# Patient Record
Sex: Male | Born: 1985 | Race: White | Hispanic: No | Marital: Married | Smoking: Current every day smoker
Health system: Southern US, Community
[De-identification: ages and names within clinical notes are randomized; demographics above are authoritative.]

## PROBLEM LIST (undated history)

## (undated) DIAGNOSIS — B999 Unspecified infectious disease: Secondary | ICD-10-CM

## (undated) DIAGNOSIS — J189 Pneumonia, unspecified organism: Secondary | ICD-10-CM

## (undated) DIAGNOSIS — K219 Gastro-esophageal reflux disease without esophagitis: Secondary | ICD-10-CM

## (undated) HISTORY — PX: WISDOM TOOTH EXTRACTION: SHX21

---

## 2015-08-31 ENCOUNTER — Emergency Department (HOSPITAL_COMMUNITY)
Admission: EM | Admit: 2015-08-31 | Discharge: 2015-09-01 | Disposition: A | Discharge status: Home / Self Care | Payer: BLUE CROSS/BLUE SHIELD | Attending: Emergency Medicine | Admitting: Emergency Medicine

## 2015-08-31 ENCOUNTER — Encounter (HOSPITAL_COMMUNITY): Payer: Self-pay | Admitting: Emergency Medicine

## 2015-08-31 DIAGNOSIS — S81831D Puncture wound without foreign body, right lower leg, subsequent encounter: Secondary | ICD-10-CM | POA: Diagnosis not present

## 2015-08-31 DIAGNOSIS — Y288XXD Contact with other sharp object, undetermined intent, subsequent encounter: Secondary | ICD-10-CM | POA: Diagnosis not present

## 2015-08-31 DIAGNOSIS — M109 Gout, unspecified: Secondary | ICD-10-CM | POA: Diagnosis present

## 2015-08-31 DIAGNOSIS — F17210 Nicotine dependence, cigarettes, uncomplicated: Secondary | ICD-10-CM | POA: Diagnosis not present

## 2015-08-31 MED ORDER — KETOROLAC TROMETHAMINE 30 MG/ML IJ SOLN
60.0000 mg | Freq: Once | INTRAMUSCULAR | Status: AC
  Administered 2015-09-01: 60 mg via INTRAMUSCULAR
  Filled 2015-08-31: qty 2

## 2015-08-31 NOTE — ED Notes (Signed)
Pt states a wire went into the left ankle last week at work and since then he has been having bilateral leg and foot pain.

## 2015-08-31 NOTE — ED Provider Notes (Signed)
CSN: [id number]     Arrival date & time 08/31/15  2243 History   By signing my name below, I, Calvin [name], attest that this documentation has been prepared under the direction and in the presence of Calvin Albe, MD at 2343.   Electronically Signed: Iona [name], ED Scribe. 09/01/2015. 12:55 AM  Chief Complaint  Patient presents with  . Leg Pain    The history is provided by the patient. No language interpreter was used.   HPI Comments: Calvin [name] is a 30 y.o. male who presents to the Emergency Department complaining of gradual onset, constant, worsening,aching right  ankle pain  after a thin wire stuck into his right medial ankle at work on 08/22/2015 (approximately 9 days ago). Pt immediately removed the wire after it was stuck and does not believe any portion of it is still in his skin. He states it was just barely in the skin. He states it was sore the following day and the day after that it started swelling.  He was seen by his PCP, Dr. Iris Pert, on Jan 19 and was prescribed  Bactrim, had blood work done, had an right ankle x-ray, and had a tetanus shot. Pt states he has not heard back from his PCP about his results.  Pt reports swelling around the right ankle where the wire struck and says the pain is worsened with walking and standing. Pt reports swelling subsided over the weekend and returned on Monday, 08/29/2015 (approximately 3 days ago). He is also having pain since the 23rd in his left foot and believes he is having foot pain due to compensating by placing more weight onto that foot.  Pt denies fever,drainage, chills, nausea, vomiting. He states moving his toes makes the pain worse, elevating his foot makes the redness and a's ankle get better. No hx gout.   PCP [facility] [place]  History reviewed. No pertinent past medical history. History reviewed. No pertinent past surgical history. No family history on file. Social History  Substance Use Topics  . Smoking status:  Current Every Day Smoker    Types: Cigarettes  . Smokeless tobacco: None  . Alcohol Use: No  employed Sober from alcohol for 90 days. Smokes 1 ppd  Review of Systems  Constitutional: Negative for fever and chills.  Gastrointestinal: Negative for nausea and vomiting.  Musculoskeletal: Positive for joint swelling and arthralgias (Bilateral ankle pain).  All other systems reviewed and are negative.   Allergies  Review of patient's allergies indicates not on file.  Home Medications   Prior to Admission medications   Medication Sig Start Date End Date Taking? Authorizing Provider  colchicine 0.6 MG tablet Take 1 tablet (0.6 mg total) by mouth 2 (two) times daily. 09/01/15   Calvin Albe, MD  doxycycline (VIBRAMYCIN) 100 MG capsule Take 1 capsule (100 mg total) by mouth 2 (two) times daily. 09/01/15   Calvin Albe, MD  naproxen (NAPROSYN) 500 MG tablet Take 1 po BID with food prn pain 09/01/15   Calvin Albe, MD   BP 118/79 mmHg  Pulse 83  Temp(Src) 98.3 F (36.8 C) (Oral)  Resp 18  Ht  (1.803 m)  Wt 174 lb (78.926 kg)  BMI 24.28 kg/m2  SpO2 96%  Vital signs normal   Physical Exam  Constitutional: He is oriented to person, place, and time. He appears well-developed and well-nourished.  Non-toxic appearance. He does not appear ill. No distress.  HENT:  Head: Normocephalic and atraumatic.  Right Ear: External ear  normal.  Left Ear: External ear normal.  Nose: Nose normal. No mucosal edema or rhinorrhea.  Mouth/Throat: Mucous membranes are normal. No dental abscesses or uvula swelling.  Eyes: Conjunctivae and EOM are normal.  Neck: Normal range of motion and full passive range of motion without pain.  Pulmonary/Chest: Effort normal. No respiratory distress. He has no rhonchi. He exhibits no crepitus.  Abdominal: Normal appearance.  Musculoskeletal: Normal range of motion. He exhibits edema and tenderness.  LLE diffuse swelling and redness over MTP joint of the great toe with  TTP. RLE mild swelling around the medial malleolus with mild redness.  No obvious puncture wound, purulent drainage, pustules, or evidence of abscess.  Neurological: He is alert and oriented to person, place, and time. He has normal strength. No cranial nerve deficit.  Skin: Skin is warm, dry and intact. No rash noted. No erythema. No pallor.  Psychiatric: He has a normal mood and affect. His speech is normal and behavior is normal. His mood appears not anxious.  Nursing note and vitals reviewed.    RLE   LLE     ED Course  Procedures (including critical care time)  Medications  doxycycline (VIBRA-TABS) tablet 100 mg (not administered)  colchicine tablet 0.6 mg (not administered)  ketorolac (TORADOL) 30 MG/ML injection 60 mg (60 mg Intramuscular Given 09/01/15 0001)    DIAGNOSTIC STUDIES: Oxygen Saturation is 96% on RA, normal by my interpretation.    COORDINATION OF CARE: 11:53 PM-Discussed treatment plan which includes DG R Ankle, Uric acid, and CBC with pt at bedside and pt agreed to plan.   0050 Pt states his pain is better. Discussed his discharge instructions.   Patient presents after a puncture wound to his right medial ankle with persistent pain that he describes as aching with mild swelling and redness. He is already been on Bactrim for almost a week without improvement of his symptoms. He reports they are having a lot of MRSA infections at work from being punctured by the wire. He also is having acute pain in his left toe consistent with acute gout although his uric acid level is normal. Doxycycline was added to his antibiotic regimen, he was also started on anti-inflammatory and colchicine. He was referred to orthopedics if he is not improving over the next several days.   Labs Review Results for orders placed or performed during the hospital encounter of 08/31/15  Uric acid  Result Value Ref Range   Uric Acid, Serum 4.9 4.4 - 7.6 mg/dL  CBC with Differential   Result Value Ref Range   WBC 5.6 4.0 - 10.5 K/uL   RBC 4.34 4.22 - 5.81 MIL/uL   Hemoglobin 13.3 13.0 - 17.0 g/dL   HCT 16.1 (L) 09.6 - 04.5 %   MCV 88.0 78.0 - 100.0 fL   MCH 30.6 26.0 - 34.0 pg   MCHC 34.8 30.0 - 36.0 g/dL   RDW 40.9 81.1 - 91.4 %   Platelets 294 150 - 400 K/uL   Neutrophils Relative % 75 %   Neutro Abs 4.2 1.7 - 7.7 K/uL   Lymphocytes Relative 12 %   Lymphs Abs 0.7 0.7 - 4.0 K/uL   Monocytes Relative 10 %   Monocytes Absolute 0.6 0.1 - 1.0 K/uL   Eosinophils Relative 2 %   Eosinophils Absolute 0.1 0.0 - 0.7 K/uL   Basophils Relative 1 %   Basophils Absolute 0.0 0.0 - 0.1 K/uL    Laboratory interpretation all normal    Imaging  Review Dg Ankle Complete Right  09/01/2015  CLINICAL DATA:  Erythema and tenderness at the medial right ankle after being stuck with piece of metal wire at the right ankle 1 week ago. Initial encounter. EXAM: RIGHT ANKLE - COMPLETE 3+ VIEW COMPARISON:  None. FINDINGS: There is no evidence of fracture or dislocation. There is no evidence of osseous erosion. The ankle mortise is intact; the interosseous space is within normal limits. No talar tilt or subluxation is seen. The joint spaces are preserved. The known puncture wound is not well characterized on radiograph. No radiopaque foreign bodies are seen. IMPRESSION: No evidence of fracture or dislocation. No evidence of osseous erosion. No radiopaque foreign bodies seen. Electronically Signed   By: Roanna Raider M.D.   On: 09/01/2015 00:21   I have personally reviewed and evaluated these images and lab results as part of my medical decision-making.   EKG Interpretation None      MDM   Final diagnoses:  Puncture wound, lower leg, right, subsequent encounter  Podagra   New Prescriptions   COLCHICINE 0.6 MG TABLET    Take 1 tablet (0.6 mg total) by mouth 2 (two) times daily.   DOXYCYCLINE (VIBRAMYCIN) 100 MG CAPSULE    Take 1 capsule (100 mg total) by mouth 2 (two) times daily.    NAPROXEN (NAPROSYN) 500 MG TABLET    Take 1 po BID with food prn pain    Plan discharge  Calvin Albe, MD, FACEP   I personally performed the services described in this documentation, which was scribed in my presence. The recorded information has been reviewed and considered.  Calvin Albe, MD, Concha Pyo, MD 09/01/15 [phone]

## 2015-09-01 ENCOUNTER — Emergency Department (HOSPITAL_COMMUNITY): Payer: BLUE CROSS/BLUE SHIELD

## 2015-09-01 LAB — CBC WITH DIFFERENTIAL/PLATELET
Basophils Absolute: 0 10*3/uL (ref 0.0–0.1)
Basophils Relative: 1 %
Eosinophils Absolute: 0.1 10*3/uL (ref 0.0–0.7)
Eosinophils Relative: 2 %
HEMATOCRIT: 38.2 % — AB (ref 39.0–52.0)
HEMOGLOBIN: 13.3 g/dL (ref 13.0–17.0)
LYMPHS PCT: 12 %
Lymphs Abs: 0.7 10*3/uL (ref 0.7–4.0)
MCH: 30.6 pg (ref 26.0–34.0)
MCHC: 34.8 g/dL (ref 30.0–36.0)
MCV: 88 fL (ref 78.0–100.0)
MONO ABS: 0.6 10*3/uL (ref 0.1–1.0)
MONOS PCT: 10 %
NEUTROS ABS: 4.2 10*3/uL (ref 1.7–7.7)
NEUTROS PCT: 75 %
Platelets: 294 10*3/uL (ref 150–400)
RBC: 4.34 MIL/uL (ref 4.22–5.81)
RDW: 11.8 % (ref 11.5–15.5)
WBC: 5.6 10*3/uL (ref 4.0–10.5)

## 2015-09-01 LAB — URIC ACID: Uric Acid, Serum: 4.9 mg/dL (ref 4.4–7.6)

## 2015-09-01 MED ORDER — NAPROXEN 500 MG PO TABS: ORAL_TABLET | ORAL | Status: AC

## 2015-09-01 MED ORDER — COLCHICINE 0.6 MG PO TABS: 0.6000 mg | ORAL_TABLET | Freq: Two times a day (BID) | ORAL | Status: AC

## 2015-09-01 MED ORDER — DOXYCYCLINE HYCLATE 100 MG PO TABS
100.0000 mg | ORAL_TABLET | Freq: Once | ORAL | Status: AC
  Administered 2015-09-01: 100 mg via ORAL
  Filled 2015-09-01: qty 1

## 2015-09-01 MED ORDER — COLCHICINE 0.6 MG PO TABS
0.6000 mg | ORAL_TABLET | Freq: Once | ORAL | Status: AC
  Administered 2015-09-01: 0.6 mg via ORAL
  Filled 2015-09-01: qty 1

## 2015-09-01 MED ORDER — DOXYCYCLINE HYCLATE 100 MG PO CAPS: 100.0000 mg | ORAL_CAPSULE | Freq: Two times a day (BID) | ORAL | Status: DC

## 2015-09-01 NOTE — Discharge Instructions (Signed)
Elevate your foot. Use ice and heat for comfort over the swollen painful areas. Finish the bactrim and start the new antibiotic. Take the medications as prescribed for pain. Follow up with Dr Romeo Apple if you pain and swelling isn't improving over the next several days. Call his office to get an appointment.

## 2015-12-19 ENCOUNTER — Other Ambulatory Visit: Payer: Self-pay | Admitting: Orthopedic Surgery

## 2015-12-19 DIAGNOSIS — M25571 Pain in right ankle and joints of right foot: Secondary | ICD-10-CM

## 2015-12-21 ENCOUNTER — Ambulatory Visit
Admission: RE | Admit: 2015-12-21 | Discharge: 2015-12-21 | Disposition: A | Discharge status: Home / Self Care | Payer: Worker's Compensation | Source: Ambulatory Visit | Attending: Orthopedic Surgery | Admitting: Orthopedic Surgery

## 2015-12-21 ENCOUNTER — Other Ambulatory Visit: Payer: BLUE CROSS/BLUE SHIELD

## 2015-12-21 DIAGNOSIS — M25571 Pain in right ankle and joints of right foot: Secondary | ICD-10-CM

## 2015-12-21 MED ORDER — GADOBENATE DIMEGLUMINE 529 MG/ML IV SOLN
16.0000 mL | Freq: Once | INTRAVENOUS | Status: AC | PRN
  Administered 2015-12-21: 16 mL via INTRAVENOUS

## 2015-12-27 ENCOUNTER — Encounter (HOSPITAL_COMMUNITY): Payer: Self-pay | Admitting: *Deleted

## 2015-12-27 NOTE — Progress Notes (Signed)
Pt denies SOB, chest pain, and being under the care of a cardiologist. Pt denies having a stress test, echo and cardiac cath. Pt denies having a chest x ray and EKG within the last year. Pt made aware to stop taking Aspirin, vitamins, fish oil and herbal medications. Do not take any NSAIDs ie: Ibuprofen, Advil, Naproxen, BC and Goody Powder or any medication containing Aspirin. Pt verbalized understanding of all pre-op instructions. 

## 2015-12-28 ENCOUNTER — Other Ambulatory Visit: Payer: Self-pay | Admitting: Orthopedic Surgery

## 2015-12-28 NOTE — Progress Notes (Signed)
LVM with Velvet, Surgical Scheduler at [phone] requesting that doctor enter orders for pt.

## 2015-12-28 NOTE — Anesthesia Preprocedure Evaluation (Signed)
Anesthesia Evaluation  Patient identified by MRN, date of birth, ID band Patient awake    Reviewed: Allergy & Precautions, NPO status , Patient's Chart, lab work & pertinent test results  Airway Mallampati: II  TM Distance: >3 FB Neck ROM: Full    Dental no notable dental hx.    Pulmonary pneumonia, Current Smoker,    Pulmonary exam normal breath sounds clear to auscultation       Cardiovascular negative cardio ROS Normal cardiovascular exam Rhythm:Regular Rate:Normal     Neuro/Psych negative neurological ROS  negative psych ROS   GI/Hepatic Neg liver ROS, GERD  ,  Endo/Other  negative endocrine ROS  Renal/GU negative Renal ROS     Musculoskeletal negative musculoskeletal ROS (+)   Abdominal   Peds  Hematology negative hematology ROS (+)   Anesthesia Other Findings   Reproductive/Obstetrics                             Anesthesia Physical Anesthesia Plan  ASA: II  Anesthesia Plan: General   Post-op Pain Management:    Induction: Intravenous  Airway Management Planned: LMA  Additional Equipment:   Intra-op Plan:   Post-operative Plan: Extubation in OR  Informed Consent: I have reviewed the patients History and Physical, chart, labs and discussed the procedure including the risks, benefits and alternatives for the proposed anesthesia with the patient or authorized representative who has indicated his/her understanding and acceptance.   Dental advisory given  Plan Discussed with: CRNA  Anesthesia Plan Comments:         Anesthesia Quick Evaluation

## 2015-12-29 ENCOUNTER — Inpatient Hospital Stay (HOSPITAL_COMMUNITY): Payer: Worker's Compensation

## 2015-12-29 ENCOUNTER — Encounter (HOSPITAL_COMMUNITY): Payer: Self-pay | Admitting: *Deleted

## 2015-12-29 ENCOUNTER — Inpatient Hospital Stay (HOSPITAL_COMMUNITY): Payer: Worker's Compensation | Admitting: Anesthesiology

## 2015-12-29 ENCOUNTER — Encounter (HOSPITAL_COMMUNITY)
Admission: RE | Disposition: A | Discharge status: Home Health | Payer: Self-pay | Source: Ambulatory Visit | Attending: Orthopedic Surgery

## 2015-12-29 ENCOUNTER — Inpatient Hospital Stay (HOSPITAL_COMMUNITY)
Admission: RE | Admit: 2015-12-29 | Discharge: 2016-01-02 | DRG: 478 | Disposition: A | Discharge status: Home Health | Payer: Worker's Compensation | Source: Ambulatory Visit | Attending: Orthopedic Surgery | Admitting: Orthopedic Surgery

## 2015-12-29 DIAGNOSIS — Z888 Allergy status to other drugs, medicaments and biological substances status: Secondary | ICD-10-CM | POA: Diagnosis not present

## 2015-12-29 DIAGNOSIS — F17210 Nicotine dependence, cigarettes, uncomplicated: Secondary | ICD-10-CM | POA: Diagnosis present

## 2015-12-29 DIAGNOSIS — Z880 Allergy status to penicillin: Secondary | ICD-10-CM

## 2015-12-29 DIAGNOSIS — M86171 Other acute osteomyelitis, right ankle and foot: Secondary | ICD-10-CM | POA: Diagnosis not present

## 2015-12-29 DIAGNOSIS — M109 Gout, unspecified: Secondary | ICD-10-CM | POA: Diagnosis present

## 2015-12-29 DIAGNOSIS — M65871 Other synovitis and tenosynovitis, right ankle and foot: Secondary | ICD-10-CM | POA: Diagnosis not present

## 2015-12-29 DIAGNOSIS — M25571 Pain in right ankle and joints of right foot: Secondary | ICD-10-CM | POA: Diagnosis present

## 2015-12-29 DIAGNOSIS — M65171 Other infective (teno)synovitis, right ankle and foot: Secondary | ICD-10-CM | POA: Diagnosis present

## 2015-12-29 DIAGNOSIS — K219 Gastro-esophageal reflux disease without esophagitis: Secondary | ICD-10-CM | POA: Diagnosis present

## 2015-12-29 DIAGNOSIS — Z9889 Other specified postprocedural states: Secondary | ICD-10-CM

## 2015-12-29 DIAGNOSIS — M86671 Other chronic osteomyelitis, right ankle and foot: Secondary | ICD-10-CM | POA: Diagnosis present

## 2015-12-29 DIAGNOSIS — Z452 Encounter for adjustment and management of vascular access device: Secondary | ICD-10-CM

## 2015-12-29 HISTORY — DX: Gastro-esophageal reflux disease without esophagitis: K21.9

## 2015-12-29 HISTORY — DX: Pneumonia, unspecified organism: J18.9

## 2015-12-29 HISTORY — DX: Unspecified infectious disease: B99.9

## 2015-12-29 HISTORY — PX: I & D EXTREMITY: SHX5045

## 2015-12-29 HISTORY — PX: INFECTED SKIN DEBRIDEMENT: SHX678

## 2015-12-29 HISTORY — PX: BONE BIOPSY: SHX375

## 2015-12-29 LAB — CBC
HCT: 41 % (ref 39.0–52.0)
HEMATOCRIT: 38.5 % — AB (ref 39.0–52.0)
HEMOGLOBIN: 12.8 g/dL — AB (ref 13.0–17.0)
Hemoglobin: 13.4 g/dL (ref 13.0–17.0)
MCH: 28 pg (ref 26.0–34.0)
MCH: 28.1 pg (ref 26.0–34.0)
MCHC: 32.7 g/dL (ref 30.0–36.0)
MCHC: 33.2 g/dL (ref 30.0–36.0)
MCV: 85.8 fL (ref 78.0–100.0)
MCV: 84.4 fL (ref 78.0–100.0)
PLATELETS: 227 10*3/uL (ref 150–400)
Platelets: 225 10*3/uL (ref 150–400)
RBC: 4.78 MIL/uL (ref 4.22–5.81)
RBC: 4.56 MIL/uL (ref 4.22–5.81)
RDW: 12.6 % (ref 11.5–15.5)
RDW: 12.5 % (ref 11.5–15.5)
WBC: 4.9 10*3/uL (ref 4.0–10.5)
WBC: 6.3 10*3/uL (ref 4.0–10.5)

## 2015-12-29 LAB — C-REACTIVE PROTEIN: CRP: 0.8 mg/dL (ref <1.0)

## 2015-12-29 LAB — CREATININE, SERUM: Creatinine, Ser: 0.97 mg/dL (ref 0.61–1.24)

## 2015-12-29 LAB — SEDIMENTATION RATE: Sed Rate: 12 mm/hr (ref 0–16)

## 2015-12-29 SURGERY — IRRIGATION AND DEBRIDEMENT EXTREMITY
Anesthesia: General | Site: Ankle | Laterality: Right

## 2015-12-29 MED ORDER — DEXTROSE 5 % IV SOLN
2.0000 g | Freq: Three times a day (TID) | INTRAVENOUS | Status: DC
  Administered 2015-12-29 – 2016-01-02 (×13): 2 g via INTRAVENOUS
  Filled 2015-12-29 (×17): qty 2

## 2015-12-29 MED ORDER — SODIUM CHLORIDE 0.9 % IR SOLN
Status: DC | PRN
  Administered 2015-12-29 (×2): 3000 mL

## 2015-12-29 MED ORDER — GENTAMICIN SULFATE 40 MG/ML IJ SOLN
INTRAMUSCULAR | Status: DC | PRN
  Administered 2015-12-29 (×2): 80 mg

## 2015-12-29 MED ORDER — PROPOFOL 10 MG/ML IV BOLUS
INTRAVENOUS | Status: DC | PRN
  Administered 2015-12-29: 200 mg via INTRAVENOUS

## 2015-12-29 MED ORDER — VANCOMYCIN HCL 500 MG IV SOLR
INTRAVENOUS | Status: AC
  Filled 2015-12-29: qty 500

## 2015-12-29 MED ORDER — OXYCODONE HCL 5 MG PO TABS
5.0000 mg | ORAL_TABLET | ORAL | Status: DC | PRN
  Administered 2015-12-29 – 2015-12-30 (×4): 10 mg via ORAL
  Administered 2015-12-30: 5 mg via ORAL
  Administered 2015-12-30 – 2016-01-02 (×12): 10 mg via ORAL
  Filled 2015-12-29 (×10): qty 2
  Filled 2015-12-29: qty 1
  Filled 2015-12-29 (×6): qty 2

## 2015-12-29 MED ORDER — CHLORHEXIDINE GLUCONATE 4 % EX LIQD: 60.0000 mL | Freq: Once | CUTANEOUS | Status: DC

## 2015-12-29 MED ORDER — LIDOCAINE HCL (CARDIAC) 20 MG/ML IV SOLN: INTRAVENOUS | Status: DC | PRN

## 2015-12-29 MED ORDER — PANTOPRAZOLE SODIUM 40 MG PO TBEC
40.0000 mg | DELAYED_RELEASE_TABLET | Freq: Every day | ORAL | Status: DC
  Administered 2015-12-30 – 2016-01-02 (×4): 40 mg via ORAL
  Filled 2015-12-29 (×4): qty 1

## 2015-12-29 MED ORDER — LIDOCAINE 2% (20 MG/ML) 5 ML SYRINGE
INTRAMUSCULAR | Status: DC | PRN
  Administered 2015-12-29: 100 mg via INTRAVENOUS

## 2015-12-29 MED ORDER — HYDROMORPHONE HCL 1 MG/ML IJ SOLN
0.2500 mg | INTRAMUSCULAR | Status: DC | PRN
  Administered 2015-12-29: 1 mg via INTRAVENOUS
  Administered 2015-12-29: 0.5 mg via INTRAVENOUS

## 2015-12-29 MED ORDER — MEPERIDINE HCL 25 MG/ML IJ SOLN
6.2500 mg | INTRAMUSCULAR | Status: DC | PRN
  Administered 2015-12-29: 12.5 mg via INTRAVENOUS

## 2015-12-29 MED ORDER — LACTATED RINGERS IV SOLN
INTRAVENOUS | Status: DC | PRN
  Administered 2015-12-29: 07:00:00 via INTRAVENOUS

## 2015-12-29 MED ORDER — VANCOMYCIN HCL IN DEXTROSE 1-5 GM/200ML-% IV SOLN
1000.0000 mg | INTRAVENOUS | Status: AC
  Administered 2015-12-29: 1000 mg via INTRAVENOUS
  Filled 2015-12-29: qty 200

## 2015-12-29 MED ORDER — FENTANYL CITRATE (PF) 100 MCG/2ML IJ SOLN
INTRAMUSCULAR | Status: DC | PRN
  Administered 2015-12-29 (×2): 50 ug via INTRAVENOUS
  Administered 2015-12-29: 100 ug via INTRAVENOUS
  Administered 2015-12-29: 50 ug via INTRAVENOUS

## 2015-12-29 MED ORDER — SENNA 8.6 MG PO TABS
1.0000 | ORAL_TABLET | Freq: Two times a day (BID) | ORAL | Status: DC
  Administered 2015-12-29 – 2016-01-02 (×9): 8.6 mg via ORAL
  Filled 2015-12-29 (×9): qty 1

## 2015-12-29 MED ORDER — VANCOMYCIN HCL 500 MG IV SOLR
INTRAVENOUS | Status: DC | PRN
Start: 2015-12-29 — End: 2015-12-29
  Administered 2015-12-29: 500 mg

## 2015-12-29 MED ORDER — BUPIVACAINE-EPINEPHRINE (PF) 0.5% -1:200000 IJ SOLN
INTRAMUSCULAR | Status: DC | PRN
  Administered 2015-12-29: 30 mL via PERINEURAL

## 2015-12-29 MED ORDER — ENOXAPARIN SODIUM 40 MG/0.4ML ~~LOC~~ SOLN
40.0000 mg | SUBCUTANEOUS | Status: DC
  Administered 2015-12-30: 40 mg via SUBCUTANEOUS
  Filled 2015-12-29: qty 0.4

## 2015-12-29 MED ORDER — MIDAZOLAM HCL 5 MG/5ML IJ SOLN
INTRAMUSCULAR | Status: DC | PRN
  Administered 2015-12-29: 2 mg via INTRAVENOUS

## 2015-12-29 MED ORDER — ONDANSETRON HCL 4 MG/2ML IJ SOLN
INTRAMUSCULAR | Status: DC | PRN
  Administered 2015-12-29: 4 mg via INTRAVENOUS

## 2015-12-29 MED ORDER — SODIUM CHLORIDE 0.9% FLUSH
10.0000 mL | INTRAVENOUS | Status: DC | PRN
  Administered 2015-12-29 – 2016-01-02 (×5): 10 mL
  Filled 2015-12-29 (×5): qty 40

## 2015-12-29 MED ORDER — MORPHINE SULFATE (PF) 2 MG/ML IV SOLN
2.0000 mg | INTRAVENOUS | Status: DC | PRN
  Administered 2015-12-29 (×2): 2 mg via INTRAVENOUS
  Filled 2015-12-29 (×2): qty 1

## 2015-12-29 MED ORDER — VANCOMYCIN HCL IN DEXTROSE 1-5 GM/200ML-% IV SOLN
1000.0000 mg | Freq: Three times a day (TID) | INTRAVENOUS | Status: DC
  Administered 2015-12-29 – 2016-01-02 (×13): 1000 mg via INTRAVENOUS
  Filled 2015-12-29 (×15): qty 200

## 2015-12-29 MED ORDER — CELECOXIB 200 MG PO CAPS
200.0000 mg | ORAL_CAPSULE | Freq: Two times a day (BID) | ORAL | Status: DC
  Administered 2015-12-29 – 2016-01-02 (×9): 200 mg via ORAL
  Filled 2015-12-29 (×9): qty 1

## 2015-12-29 MED ORDER — MEPERIDINE HCL 25 MG/ML IJ SOLN
INTRAMUSCULAR | Status: AC
  Filled 2015-12-29: qty 1

## 2015-12-29 MED ORDER — COLCHICINE 0.6 MG PO TABS
0.6000 mg | ORAL_TABLET | Freq: Two times a day (BID) | ORAL | Status: DC
  Administered 2015-12-29 – 2015-12-30 (×3): 0.6 mg via ORAL
  Filled 2015-12-29 (×3): qty 1

## 2015-12-29 MED ORDER — SODIUM CHLORIDE 0.9 % IV SOLN: INTRAVENOUS | Status: DC

## 2015-12-29 MED ORDER — DOCUSATE SODIUM 100 MG PO CAPS
100.0000 mg | ORAL_CAPSULE | Freq: Two times a day (BID) | ORAL | Status: DC
  Administered 2015-12-29 – 2016-01-02 (×9): 100 mg via ORAL
  Filled 2015-12-29 (×9): qty 1

## 2015-12-29 MED ORDER — HYDROMORPHONE HCL 1 MG/ML IJ SOLN
INTRAMUSCULAR | Status: AC
  Filled 2015-12-29: qty 2

## 2015-12-29 MED ORDER — ONDANSETRON HCL 4 MG/2ML IJ SOLN
4.0000 mg | Freq: Four times a day (QID) | INTRAMUSCULAR | Status: DC | PRN
Start: 2015-12-29 — End: 2016-01-02

## 2015-12-29 MED ORDER — ONDANSETRON HCL 4 MG PO TABS
4.0000 mg | ORAL_TABLET | Freq: Four times a day (QID) | ORAL | Status: DC | PRN
  Administered 2015-12-30: 4 mg via ORAL
  Filled 2015-12-29: qty 1

## 2015-12-29 MED ORDER — ACETAMINOPHEN 325 MG PO TABS
650.0000 mg | ORAL_TABLET | Freq: Four times a day (QID) | ORAL | Status: DC | PRN
  Administered 2015-12-29: 650 mg via ORAL
  Filled 2015-12-29: qty 2

## 2015-12-29 MED ORDER — ACETAMINOPHEN 650 MG RE SUPP: 650.0000 mg | Freq: Four times a day (QID) | RECTAL | Status: DC | PRN

## 2015-12-29 MED ORDER — GENTAMICIN SULFATE 40 MG/ML IJ SOLN
INTRAMUSCULAR | Status: AC
  Filled 2015-12-29: qty 4

## 2015-12-29 MED ORDER — SODIUM CHLORIDE 0.9 % IV SOLN
INTRAVENOUS | Status: DC
  Administered 2015-12-29: 11:00:00 via INTRAVENOUS

## 2015-12-29 SURGICAL SUPPLY — 71 items
BANDAGE ELASTIC 4 VELCRO ST LF (GAUZE/BANDAGES/DRESSINGS) ×3 IMPLANT
BANDAGE ESMARK 6X9 LF (GAUZE/BANDAGES/DRESSINGS) ×1 IMPLANT
BLADE SURG 10 STRL SS (BLADE) ×3 IMPLANT
BLADE SURG 11 STRL SS (BLADE) ×3 IMPLANT
BNDG COHESIVE 4X5 TAN STRL (GAUZE/BANDAGES/DRESSINGS) ×3 IMPLANT
BNDG COHESIVE 6X5 TAN STRL LF (GAUZE/BANDAGES/DRESSINGS) ×3 IMPLANT
BNDG CONFORM 3 STRL LF (GAUZE/BANDAGES/DRESSINGS) ×3 IMPLANT
BNDG ESMARK 6X9 LF (GAUZE/BANDAGES/DRESSINGS) ×3
CANISTER SUCT 3000ML PPV (MISCELLANEOUS) ×3 IMPLANT
CHLORAPREP W/TINT 26ML (MISCELLANEOUS) ×3 IMPLANT
COVER SURGICAL LIGHT HANDLE (MISCELLANEOUS) ×3 IMPLANT
CUFF TOURNIQUET SINGLE 34IN LL (TOURNIQUET CUFF) ×3 IMPLANT
CUFF TOURNIQUET SINGLE 44IN (TOURNIQUET CUFF) IMPLANT
DRAIN CHANNEL 10M FLAT 3/4 FLT (DRAIN) IMPLANT
DRAIN PENROSE 1/2X12 LTX STRL (WOUND CARE) IMPLANT
DRAPE C-ARM MINI 42X72 WSTRAPS (DRAPES) ×3 IMPLANT
DRAPE U-SHAPE 47X51 STRL (DRAPES) ×3 IMPLANT
DRSG ADAPTIC 3X8 NADH LF (GAUZE/BANDAGES/DRESSINGS) IMPLANT
DRSG MEPITEL 4X7.2 (GAUZE/BANDAGES/DRESSINGS) ×3 IMPLANT
DRSG PAD ABDOMINAL 8X10 ST (GAUZE/BANDAGES/DRESSINGS) IMPLANT
ELECT REM PT RETURN 9FT ADLT (ELECTROSURGICAL) ×3
ELECTRODE REM PT RTRN 9FT ADLT (ELECTROSURGICAL) ×1 IMPLANT
EVACUATOR SILICONE 100CC (DRAIN) IMPLANT
GAUZE SPONGE 4X4 12PLY STRL (GAUZE/BANDAGES/DRESSINGS) IMPLANT
GLOVE BIO SURGEON STRL SZ7 (GLOVE) ×6 IMPLANT
GLOVE BIO SURGEON STRL SZ8 (GLOVE) ×3 IMPLANT
GLOVE BIOGEL PI IND STRL 7.5 (GLOVE) ×1 IMPLANT
GLOVE BIOGEL PI IND STRL 8 (GLOVE) ×2 IMPLANT
GLOVE BIOGEL PI INDICATOR 7.5 (GLOVE) ×2
GLOVE BIOGEL PI INDICATOR 8 (GLOVE) ×4
GLOVE ECLIPSE 7.5 STRL STRAW (GLOVE) ×3 IMPLANT
GOWN STRL REUS W/ TWL LRG LVL3 (GOWN DISPOSABLE) ×2 IMPLANT
GOWN STRL REUS W/ TWL XL LVL3 (GOWN DISPOSABLE) ×2 IMPLANT
GOWN STRL REUS W/TWL LRG LVL3 (GOWN DISPOSABLE) ×4
GOWN STRL REUS W/TWL XL LVL3 (GOWN DISPOSABLE) ×4
KIT BASIN OR (CUSTOM PROCEDURE TRAY) ×3 IMPLANT
KIT ROOM TURNOVER OR (KITS) ×3 IMPLANT
KIT STIMULAN RAPID CURE 5CC (Orthopedic Implant) ×3 IMPLANT
NEEDLE 22X1 1/2 (OR ONLY) (NEEDLE) IMPLANT
NEEDLE BIOPSY JAMSHIDI 8X6 (NEEDLE) ×3 IMPLANT
NS IRRIG 1000ML POUR BTL (IV SOLUTION) ×3 IMPLANT
PACK ORTHO EXTREMITY (CUSTOM PROCEDURE TRAY) ×3 IMPLANT
PAD ABD 8X10 STRL (GAUZE/BANDAGES/DRESSINGS) ×3 IMPLANT
PAD ARMBOARD 7.5X6 YLW CONV (MISCELLANEOUS) ×6 IMPLANT
PAD CAST 4YDX4 CTTN HI CHSV (CAST SUPPLIES) ×1 IMPLANT
PADDING CAST COTTON 4X4 STRL (CAST SUPPLIES) ×2
PADDING CAST COTTON 6X4 STRL (CAST SUPPLIES) ×3 IMPLANT
SET CYSTO W/LG BORE CLAMP LF (SET/KITS/TRAYS/PACK) ×3 IMPLANT
SOAP 2 % CHG 4 OZ (WOUND CARE) ×3 IMPLANT
SPLINT PLASTER CAST XFAST 5X30 (CAST SUPPLIES) ×1 IMPLANT
SPLINT PLASTER XFAST SET 5X30 (CAST SUPPLIES) ×2
SPONGE GAUZE 4X4 12PLY STER LF (GAUZE/BANDAGES/DRESSINGS) ×3 IMPLANT
SPONGE LAP 4X18 X RAY DECT (DISPOSABLE) ×3 IMPLANT
STAPLER VISISTAT 35W (STAPLE) IMPLANT
SUCTION FRAZIER HANDLE 10FR (MISCELLANEOUS) ×2
SUCTION TUBE FRAZIER 10FR DISP (MISCELLANEOUS) ×1 IMPLANT
SUT ETHILON 2 0 FS 18 (SUTURE) ×6 IMPLANT
SUT PDS AB 0 CT 36 (SUTURE) ×3 IMPLANT
SUT PROLENE 3 0 PS 2 (SUTURE) IMPLANT
SUT VIC AB 2-0 CT1 27 (SUTURE)
SUT VIC AB 2-0 CT1 TAPERPNT 27 (SUTURE) IMPLANT
SUT VIC AB 3-0 PS2 18 (SUTURE)
SUT VIC AB 3-0 PS2 18XBRD (SUTURE) IMPLANT
SYR CONTROL 10ML LL (SYRINGE) IMPLANT
TOWEL OR 17X24 6PK STRL BLUE (TOWEL DISPOSABLE) ×3 IMPLANT
TOWEL OR 17X26 10 PK STRL BLUE (TOWEL DISPOSABLE) ×3 IMPLANT
TUBE CONNECTING 12'X1/4 (SUCTIONS) ×1
TUBE CONNECTING 12X1/4 (SUCTIONS) ×2 IMPLANT
TUBING CYSTO DISP (UROLOGICAL SUPPLIES) ×6 IMPLANT
WATER STERILE IRR 1000ML POUR (IV SOLUTION) ×3 IMPLANT
YANKAUER SUCT BULB TIP NO VENT (SUCTIONS) ×3 IMPLANT

## 2015-12-29 NOTE — Transfer of Care (Signed)
Immediate Anesthesia Transfer of Care Note  Patient: Calvin [name]  Procedure(s) Performed: Procedure(s): RIGHT ANKLE IRRIGATION AND DEBRIDEMENT (Right) OPEN BIOPSY OF THE RIGHT MEDIAL MALLEOLUS AND NAVICULAR (Right)  Patient Location: PACU  Anesthesia Type:GA combined with regional for post-op pain  Level of Consciousness: awake, alert  and oriented  Airway & Oxygen Therapy: Patient Spontanous Breathing and Patient connected to nasal cannula oxygen  Post-op Assessment: Report given to RN, Post -op Vital signs reviewed and stable and Patient moving all extremities X 4  Post vital signs: Reviewed and stable  Last Vitals:  Filed Vitals:   12/29/15 0641 12/29/15 0943  BP: 128/80   Pulse: 68   Temp: 36.7 C 36.4 C  Resp: 20     Last Pain: There were no vitals filed for this visit.       Complications: No apparent anesthesia complications

## 2015-12-29 NOTE — Consult Note (Signed)
[facility]  Total days of antibiotics 1        Day 1 vanco        Day 1 cefepime               Reason for Consult:     Referring Physician: hewitt  Active Problems:   Injury, superficial, hip, thigh, leg, or ankle with infection    HPI: Calvin [name] is a 30 y.o. male who is in good state of health until he sustained injury to right medial ankle in mid January at work. He describes a metal wire poking in the medial side of the ankle just posterior to the medial malleolus. He initially sought care at urgent care roughly 5 days after the injury where he was dx with gout, then eventually was referred to ortho and ID in [place] where infection was suspected. He was placed on 8 wk of daptomycin empirically, but still had difficulty with weight bearing on right leg. He had not undergone any biopsy or i x d prior to this admit. He has had picc line in for 2 months without difficulty. He was referred to Dr. Doran Durand for second opinion due also noticing increase with weight bearing on right leg as well as change of arch of foot. MRI is concerning for osteomyelitis of the right medial talus, medial malleolus and medial navicular. He has been off of all abx for 3 days PTA. He was admitted for I and D of the medial ankle. Tissue sent for culture. He was empirically started on vancomycin and cefepime.  Past Medical History  Diagnosis Date  . Infection     in right ankle with tendon rupture  . GERD (gastroesophageal reflux disease)   . Pneumonia     Allergies:  Allergies  Allergen Reactions  . Amoxicillin Hives  . Other Hives    RONDEC   MEDICATIONS: . ceFEPime (MAXIPIME) IV  2 g Intravenous Q8H  . celecoxib  200 mg Oral Q12H  . colchicine  0.6 mg Oral BID  . docusate sodium  100 mg Oral BID  . [START ON 12/30/2015] enoxaparin (LOVENOX) injection  40 mg Subcutaneous Q24H  . HYDROmorphone      . meperidine      . [START ON 12/30/2015] pantoprazole  40 mg Oral  Daily  . senna  1 tablet Oral BID  . vancomycin  1,000 mg Intravenous Q8H    Social History  Substance Use Topics  . Smoking status: Current Every Day Smoker -- 0.25 packs/day    Types: Cigarettes  . Smokeless tobacco: Former Systems developer    Types: Chew  . 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 Hypertension Father   . Hypertension Other   . Diabetes Other      Review of Systems  Constitutional: Negative for fever, chills, diaphoresis, activity change, appetite change, fatigue and unexpected weight change.  HENT: Negative for congestion, sore throat, rhinorrhea, sneezing, trouble swallowing and sinus pressure.  Eyes: Negative for photophobia and visual disturbance.  Respiratory: Negative for cough, chest tightness, shortness of breath, wheezing and stridor.  Cardiovascular: Negative for chest pain, palpitations and leg swelling.  Gastrointestinal: Negative for nausea, vomiting, abdominal pain, diarrhea, constipation, blood in stool, abdominal distention and anal bleeding.  Genitourinary: Negative for dysuria, hematuria, flank pain and difficulty urinating.  Musculoskeletal: +right ankle pain Negative for myalgias, back pain, joint  swelling, arthralgias and gait problem.  Skin: Negative for color change, pallor, rash and wound.  Neurological: Negative for dizziness, tremors, weakness and light-headedness.  Hematological: Negative for adenopathy. Does not bruise/bleed easily.  Psychiatric/Behavioral: Negative for behavioral problems, confusion, sleep disturbance, dysphoric mood, decreased concentration and agitation.     OBJECTIVE: Temp:  [97.1 F (36.2 C)-98.1 F (36.7 C)] 97.1 F (36.2 C) (05/25 1500) Pulse Rate:  [67-68] 68 (05/25 1500) Resp:  [12-20] 15 (05/25 1500) BP: (128-137)/(80-97) 132/82 mmHg (05/25 1500) SpO2:  [100 %] 100 % (05/25 1500) Weight:  [170 lb (77.111 kg)] 170 lb (77.111 kg) (05/25  0641) Physical Exam  Constitutional: He is oriented to person, place, and time. He appears well-developed and well-nourished. No distress.  HENT:  Mouth/Throat: Oropharynx is clear and moist. No oropharyngeal exudate.  Cardiovascular: Normal rate, regular rhythm and normal heart sounds. Exam reveals no gallop and no friction rub.  No murmur heard.  Pulmonary/Chest: Effort normal and breath sounds normal. No respiratory distress. He has no wheezes.  Abdominal: Soft. Bowel sounds are normal. He exhibits no distension. There is no tenderness.  Lymphadenopathy:  He has no cervical adenopathy.  Neurological: He is alert and oriented to person, place, and time.  Skin: Skin is warm and dry. No rash noted. No erythema. Right arm tattoo. Right arm picc line c/d/i no erythema Ext: right lower leg wrapped Psychiatric: He has a normal mood and affect. His behavior is normal.    LABS: Results for orders placed or performed during the hospital encounter of 12/29/15 (from the past 48 hour(s))  CBC     Status: None   Collection Time: 12/29/15  7:12 AM  Result Value Ref Range   WBC 4.9 4.0 - 10.5 K/uL   RBC 4.78 4.22 - 5.81 MIL/uL   Hemoglobin 13.4 13.0 - 17.0 g/dL   HCT 41.0 39.0 - 52.0 %   MCV 85.8 78.0 - 100.0 fL   MCH 28.0 26.0 - 34.0 pg   MCHC 32.7 30.0 - 36.0 g/dL   RDW 12.6 11.5 - 15.5 %   Platelets 227 150 - 400 K/uL  Sedimentation rate     Status: None   Collection Time: 12/29/15 11:45 AM  Result Value Ref Range   Sed Rate 12 0 - 16 mm/hr  C-reactive protein     Status: None   Collection Time: 12/29/15 11:45 AM  Result Value Ref Range   CRP 0.8 <1.0 mg/dL  CBC     Status: Abnormal   Collection Time: 12/29/15 11:45 AM  Result Value Ref Range   WBC 6.3 4.0 - 10.5 K/uL   RBC 4.56 4.22 - 5.81 MIL/uL   Hemoglobin 12.8 (L) 13.0 - 17.0 g/dL   HCT 38.5 (L) 39.0 - 52.0 %   MCV 84.4 78.0 - 100.0 fL   MCH 28.1 26.0 - 34.0 pg   MCHC 33.2 30.0 - 36.0 g/dL   RDW 12.5 11.5 - 15.5 %    Platelets 225 150 - 400 K/uL  Creatinine, serum     Status: None   Collection Time: 12/29/15 11:45 AM  Result Value Ref Range   Creatinine, Ser 0.97 0.61 - 1.24 mg/dL   GFR calc non Af Amer >60 >60 mL/min   GFR calc Af Amer >60 >60 mL/min    Comment: (NOTE) The eGFR has been calculated using the CKD EPI equation. This calculation has not been validated in all clinical situations. eGFR's persistently <60 mL/min signify possible Chronic Kidney Disease.  MICRO: pending IMAGING: Dg Chest 1 View  12/29/2015  CLINICAL DATA:  Assess PICC EXAM: CHEST 1 VIEW COMPARISON:  None. FINDINGS: The heart size and mediastinal contours are within normal limits. Both lungs are clear. The visualized skeletal structures are unremarkable. PICC has been inserted from the RIGHT arm. Tip lies at cavoatrial junction. No pneumothorax. IMPRESSION: No active disease. Satisfactory PICC line placement. Electronically Signed   By: Staci Righter M.D.   On: 12/29/2015 15:08  MRI   CLINICAL DATA: The patient was stuck with a piece of metal wire in the medial right ankle on August 22, 2015. He subsequently developed an infection and has been on antibiotics since March, 2017. Question osteomyelitis. Subsequent encounter.  EXAM: MRI OF THE RIGHT ANKLE WITHOUT AND WITH CONTRAST  TECHNIQUE: Multiplanar, multisequence MR imaging of the ankle was performed before and after the administration of intravenous contrast.  CONTRAST: 16 ml MULTIHANCE GADOBENATE DIMEGLUMINE 529 MG/ML IV SOLN  COMPARISON: Plain films the right ankle 08/31/2015.  FINDINGS: Extensive soft tissue edema and enhancement are seen about the medial aspect of the ankle at and just below the medial malleolus. Subcutaneous edema and enhancement are also seen along the lateral calcaneus posteriorly.  TENDONS  Peroneal: Unremarkable.  Posteromedial: Fluid and intense enhancement are seen in the sheath of the tibialis posterior  tendon. Milder degree of enhancement and a smaller amount of fluid is seen about the flexor digitorum longus and flexor hallucis longus tendons.  Anterior: Unremarkable.  Achilles: Unremarkable.  Plantar Fascia: Unremarkable.  LIGAMENTS  Lateral: Intact.  Medial: Intact. Marked edema and enhancement are seen about the deltoid ligament.  CARTILAGE  Ankle Joint: No joint effusion.  Subtalar Joints/Sinus Tarsi: Soft tissue edema and enhancement are seen in the sinus tarsi. No ligament tear.  Bones: Marrow edema and enhancement are seen throughout the medial malleolus, in the medial 2 cm of the navicular centered at the PTT insertion and in the posterior and lateral calcaneus. Mild edema and enhancement are also seen about the far medial periphery of the posterior facet of the subtalar joint.  IMPRESSION Findings consistent with osteomyelitis throughout the medial malleolus, the medial 2 cm of the navicular and in the posterior, lateral calcaneus.  Mild edema and enhancement about the far periphery of the posterior facet of the subtalar joint could be due to osteomyelitis  Findings most consistent with septic PTT tenosynovitis. Milder degree of the fluid and enhancement about the flexor digitorum longus the flexor hallucis longus tendons could be reactive but could also be due to septic tenosynovitis.  Edema and enhancement in the soft tissues of the sinus tarsi is worrisome for infection. No abscess.  Cellulitis about the medial malleolus and posterior, lateral calcaneus. No soft tissue abscess is identified.    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  - continue with cefepime and vancomycin  For empiric regimen and will likely change regimen pending culture results  Health maintenance = will check hiv  ab  Calvin [name] B. [facility] [phone]

## 2015-12-29 NOTE — Op Note (Addendum)
NAMEHELIOS, KOHLMANN NO.:  [id number]  MEDICAL RECORD NO.:  [id number]  LOCATION:  [facility]                         FACILITY:  [facility]  PHYSICIAN:  Toni Arthurs, MD        DATE OF BIRTH:  November 19, 1985  DATE OF PROCEDURE:  12/29/2015 DATE OF DISCHARGE:                              OPERATIVE REPORT   PREOPERATIVE DIAGNOSES: 1. Right posterior tibial tendon septic tenosynovitis. 2. Osteomyelitis of the medial navicular. 3. Possible osteomyelitis of the medial talus and medial malleolus.  POSTOPERATIVE DIAGNOSES: 1. Right posterior tibial tendon septic tenosynovitis. 2. Osteomyelitis of the medial navicular. 3. Possible osteomyelitis of the medial talus and medial malleolus.  PROCEDURES: 1. Right posterior tibial tendon tenolysis. 2. Right flexor digitorum longus tenolysis. 3. Right navicular excisional debridement and ostectomy. 4. Right talus bone biopsy. 5. Right medial malleolus bone biopsy. 6. Right subtalar arthrotomy with irrigation and debridement through a     separate incision.  SURGEON:  Toni Arthurs, MD.  ASSISTANT:  Alfredo Martinez, PA-C.  ANESTHESIA:  General, regional.  ESTIMATED BLOOD LOSS:  Minimal.  TOURNIQUET TIME:  95 minutes at 250 mmHg.  COMPLICATIONS:  None apparent.  DISPOSITION:  Extubated, awake, and stable to recovery.  SPECIMENS: 1. Deep tissue from the posterior tibial tendon and navicular to     Microbiology for culture. 2. Bone biopsy from the medial navicular, medial talus, and medial     malleolus to Pathology.  INDICATIONS FOR PROCEDURE:  The patient is a 30 year old male with past medical history significant for smoking.  He sustained an injury to his medial ankle at work back in early January when a wire poked through the skin into his medial ankle.  He was started on oral antibiotics the following day by his primary care doctor.  He was later started on IV daptomycin by Dr. Celine Mans in [place], [place].  He was on  daptomycin for approximately 2 months until this past Monday.  He thought has signs and symptoms consistent with septic tenosynovitis of his poster tibial tendon.  He has an MRI, which suggests osteomyelitis of the medial malleolus, medial talus, and medial navicular.  He also has MRI findings of fluid in the sinus tarsi.  He presents today for operative treatment of these complicated conditions.  He understands the risks and benefits, the alternative treatment options, and elects surgical treatment.  He specifically understands risks of bleeding, infection, nerve damage, blood clots, need for additional surgery, continued pain, nonunion amputation, and death.  PROCEDURE IN DETAIL:  After preoperative consent was obtained and the correct operative site was identified, the patient was brought to the operating room and placed supine on the operating table.  General anesthesia was induced.  Preoperative antibiotics were administered. Surgical time-out was taken.  Right lower extremity was prepped and draped in standard sterile fashion with a tourniquet around the thigh. The extremity was exsanguinated proximal to the operative site and tourniquet inflated to 250 mmHg.  A curvilinear incision was then made along the course of posterior tibial tendon sheath.  Sharp dissection was carried down through skin and subcutaneous tissue.  The sheath was incised and opened proximally and distally from the area proximal to the  medial malleolus down to the insertion on the navicular.  An intense synovitic response was noted along the full length of the posterior tibial tendon involving the tendon as well as the tendon sheath.  There was no frank pus identified.  There was some serous fluid along the tendon sheath.  The tendon sheath was then excisionally debrided of all of the synovitis adherent to the sheath and the tendon itself using scissors.  Specimens were collected and sent to Microbiology and  Pathology.  The deep tendon was debrided with scissors, rongeur, and scalpel.  The flexor digitorum longus tendon sheath was then opened and released distally.  The FDL tendon appeared generally healthy, although it did have some tenosynovitis.  This was excisionally debrided sharply with scissors.  The medial wound was then irrigated copiously with 3 L of normal saline. Again, sharp debridement was performed removing all remaining synovitis.  Attention was then turned to the lateral aspect of the hindfoot where a longitudinal incision was made at the base of the sinus tarsi.  Sharp dissection was carried down through skin and subcutaneous tissue.  The extensor digitorum brevis muscle belly was elevated partially allowing exposure of the sinus tarsi and the subtalar joint.  Posterior facet was examined and was noted to be healthy with no evidence of purulence or significant effusion.  The sinus tarsi had some fluid in it, but no pus. Dissection was carried through the anterior to the middle facet and out through the medial incision.  This correlated with the area of the deep deltoid ligament that was noted to be quite thickened with reactive soft tissue.  The sinus tarsi was then irrigated copiously with several liters of normal saline.  Attention was then returned to the medial wound where a Jamshidi needle was used to obtain a biopsy of the medial navicular.  The bone felt quite abnormal and was soft in this area.  An area of several mL was excisionally debrided with a rongeur and curette down to what felt like healthy bone.  Biopsies were then obtained of the neck of the talus and the medial malleolus.  All specimens were sent to pathology for pathologic examination.  The navicular debridement was sent to Microbiology for culture as well.  Wound was again irrigated copiously.  Calcium sulfate beads mixed with vancomycin and gentamicin packed into the area of navicular, posterior  tibial tendon sheath, the FDL tendon sheath, and the sinus tarsi.  The wounds were closed with simple sutures of 0 PDS and 2-0 nylon at the level of the skin.  Sterile dressings were applied followed by well-padded short-leg splint.  Antibiotics were held until the culture results were obtained.  At that point, 2 g of IV Ancef were administered.  The patient was then awakened from anesthesia and transported to recovery room in stable condition.  FOLLOWUP PLAN:  The patient will be admitted to the inpatient unit.  He will be nonweightbearing on the right lower extremity.  Will have Infectious Disease consultation for him as well as starting some physical therapy for ambulation.  He will need case management for IV antibiotics postoperatively.  Alfredo MartinezJustin Ollis, PA-C was present and scrubbed for the duration of the case.  His assistance was essential in positioning the patient, prepping and draping, gaining and maintaining exposure, performing the operation, closing and dressing the wounds, and applying the splint.     Toni ArthursJohn Blas Riches, MD     JH/MEDQ  D:  12/29/2015  T:  12/29/2015  Job:  [id number] 

## 2015-12-29 NOTE — H&P (Signed)
Calvin [name] is an 30 y.o. male.   Chief Complaint: right ankle pain HPI: 30 y/o male with injury to right medial ankle in January at work.  He describes a metal wire poking in the medial side of the ankle just posterior to the medial malleolus.  He went on to develop infection there and has been on IV daptomycin for almost two months.  He also notes that his arch has fallen on that side.  He has difficulty bearing weight on the r LE.  He has been treated by an infectious disease MD, Dr. Celine [name], in [place].  He presents today for I and D of the medial ankle.  MRI is concerning for osteomyelitis of the right medial talus, medial malleolus and medial navicular.  He has been off of all abx since Monday - 3 days.  Past Medical History  Diagnosis Date  . Infection     in right ankle with tendon rupture  . GERD (gastroesophageal reflux disease)   . Pneumonia     Past Surgical History  Procedure Laterality Date  . Wisdom tooth extraction      Family History  Problem Relation Age of Onset  . Hypertension Father   . Hypertension Other   . Diabetes Other    Social History:  reports that he has been smoking Cigarettes.  He has been smoking about 0.25 packs per day. He has quit using smokeless tobacco. His smokeless tobacco use included Chew. He reports that he does not drink alcohol or use illicit drugs.  Allergies:  Allergies  Allergen Reactions  . Amoxicillin Hives  . Other Hives    RONDEC    Medications Prior to Admission  Medication Sig Dispense Refill  . colchicine 0.6 MG tablet Take 1 tablet (0.6 mg total) by mouth 2 (two) times daily. 14 tablet 0  . doxycycline (VIBRAMYCIN) 100 MG capsule Take 1 capsule (100 mg total) by mouth 2 (two) times daily. 20 capsule 0  . HYDROcodone-acetaminophen (NORCO/VICODIN) 5-325 MG tablet Take 1 tablet by mouth every 6 (six) hours as needed for moderate pain.    Marland Kitchen HYDROcodone-acetaminophen (NORCO/VICODIN) 5-325 MG tablet Take 1 tablet by mouth  every 6 (six) hours as needed for moderate pain.    Marland Kitchen omeprazole (PRILOSEC) 20 MG capsule Take 20 mg by mouth daily.    Marland Kitchen omeprazole (PRILOSEC) 20 MG capsule Take 20 mg by mouth daily.    . naproxen (NAPROSYN) 500 MG tablet Take 1 po BID with food prn pain (Patient not taking: Reported on 12/27/2015) 30 tablet 0    No results found for this or any previous visit (from the past 48 hour(s)). No results found.  ROS  + night sweat last night.  No f/c/n/v.  No decreased appetite.    Blood pressure 128/80, pulse 68, temperature 98.1 F (36.7 C), temperature source Oral, resp. rate 20, height  (1.803 m), weight 77.111 kg (170 lb), SpO2 100 %. Physical Exam  wn wd male in nad.  A and O x 4.  Mood and affect normal.  EOMI.  Resp unlabored.  R medial ankle with swelling, warmth, erythema and tenderness.  No drainage.  No skin breakdown or ulcers.  Unable to do a single heel rise on the R.  No lymphadenopathy.  4/5 strength at the pTT.  Heelcord is tight.  Sens to LT intact in the saphenous n dist.  Assessment/Plan R ankle septic tenosynovitis and possible osteomyelitis of the talus, navicular and medial malleolus.  To  OR for I and D of the PTT.  Debridement or biopsy of the bone.  Possible abx beads.  The risks and benefits of the alternative treatment options have been discussed in detail.  The patient wishes to proceed with surgery and specifically understands risks of bleeding, infection, nerve damage, blood clots, need for additional surgery, amputation and death.   Calvin [name], Calvin Verret, MD 12/29/2015, 7:27 AM

## 2015-12-29 NOTE — Brief Op Note (Signed)
12/29/2015  9:33 AM  PATIENT:  Calvin HopeMichael Rupnow  30 y.o. male  PRE-OPERATIVE DIAGNOSIS: 1.  RIGHT POSTERIOR TIBIAL TENDON SEPTIC TENOSYNOVITIS      2.  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  Possible osteomyelitis of the talus   Procedure(s): 1.  Right posterior tibial tendon tenolysis   2.  Right FDL tenolysis   3.  Right navicular ostectomy and debridement   4.  Right talus bone biopsy   5.  Right medial malleolus bone biopsy   6.  Right subtalar arthrotomy (separate incision)  SURGEON:  Toni ArthursJohn Yaris Ferrell, MD  ASSISTANT:  Alfredo MartinezJustin Ollis, PA-C  ANESTHESIA:   General, regional  EBL:  minimal   TOURNIQUET:   Total Tourniquet Time Documented: Thigh (Right) - 95 minutes Total: Thigh (Right) - 95 minutes  COMPLICATIONS:  None apparent  DISPOSITION:  Extubated, awake and stable to recovery.  DICTATION ID:  [id number]

## 2015-12-29 NOTE — Anesthesia Postprocedure Evaluation (Signed)
Anesthesia Post Note  Patient: Calvin [name]  Procedure(s) Performed: Procedure(s) (LRB): RIGHT ANKLE IRRIGATION AND DEBRIDEMENT (Right) OPEN BIOPSY OF THE RIGHT MEDIAL MALLEOLUS AND NAVICULAR (Right)  Patient location during evaluation: PACU Anesthesia Type: General and Regional Level of consciousness: sedated and patient cooperative Pain management: pain level controlled Vital Signs Assessment: post-procedure vital signs reviewed and stable Respiratory status: spontaneous breathing Cardiovascular status: stable Anesthetic complications: no    Last Vitals:  Filed Vitals:   12/29/15 0943 12/29/15 1006  BP:  137/97  Pulse:  67  Temp: 36.4 C   Resp:  12    Last Pain:  Filed Vitals:   12/29/15 1010  PainSc: 3                  Daniell Mancinas [employer]

## 2015-12-29 NOTE — Addendum Note (Signed)
Addendum  created 12/29/15 1044 by Quentin OreLuz E Brigitte Soderberg, CRNA   Modules edited: Anesthesia Medication Administration

## 2015-12-29 NOTE — Anesthesia Procedure Notes (Addendum)
Procedure Name: LMA Insertion Date/Time: 12/29/2015 7:40 AM Performed by: Sharlene DoryWALKER, LUZ E Pre-anesthesia Checklist: Patient identified, Emergency Drugs available, Suction available, Patient being monitored and Timeout performed Patient Re-evaluated:Patient Re-evaluated prior to inductionOxygen Delivery Method: Circle system utilized Preoxygenation: Pre-oxygenation with 100% oxygen Intubation Type: IV induction Ventilation: Mask ventilation without difficulty LMA: LMA inserted LMA Size: 4.0 Number of attempts: 1 Placement Confirmation: positive ETCO2 and breath sounds checked- equal and bilateral Tube secured with: Tape Dental Injury: Teeth and Oropharynx as per pre-operative assessment    Anesthesia Regional Block:  Adductor canal block  Pre-Anesthetic Checklist: ,, timeout performed, Correct Patient, Correct Site, Correct Laterality, Correct Procedure, Correct Position, site marked, Risks and benefits discussed, Surgical consent,  Pre-op evaluation,  Post-op pain management  Laterality: Right  Prep: chloraprep       Needles:  Injection technique: Single-shot  Needle Type: Stimiplex     Needle Length: 9cm 9 cm Needle Gauge: 21 and 21 G    Additional Needles:  Procedures: ultrasound guided (picture in chart) Adductor canal block Narrative:  Injection made incrementally with aspirations every 5 mL.  Performed by: Personally  Anesthesiologist: Lewie LoronGERMEROTH, Keri Veale  Additional Notes: BP cuff, EKG monitors applied. Sedation begun. Artery and nerve location verified with U/S and anesthetic injected incrementally, slowly, and after negative aspirations under direct u/s guidance. Good fascial /perineural spread. Tolerated well.

## 2015-12-29 NOTE — Discharge Instructions (Signed)
Toni ArthursJohn Hewitt, MD [facility]  Please read the following information regarding your care after surgery.  Medications  You only need a prescription for the narcotic pain medicine (ex. oxycodone, Percocet, Norco).  All of the other medicines listed below are available over the counter. X acetominophen (Tylenol) 650 mg every 4-6 hours as you need for minor pain X oxycodone as prescribed for moderate to severe pain ?   Narcotic pain medicine (ex. oxycodone, Percocet, Vicodin) will cause constipation.  To prevent this problem, take the following medicines while you are taking any pain medicine. X docusate sodium (Colace) 100 mg twice a day X senna (Senokot) 2 tablets twice a day  X To help prevent blood clots, take a baby aspirin (81 mg) twice a day until you are allowed to initiate weightbearing on your operative extremity.  You should also get up every hour while you are awake to move around.    Weight Bearing X Do not bear any weight on the operated leg or foot.  Cast / Splint / Dressing X Keep your splint or cast clean and dry.  Dont put anything (coat hanger, pencil, etc) down inside of it.  If it gets damp, use a hair dryer on the cool setting to dry it.  If it gets soaked, call the office to schedule an appointment for a cast change.  After your dressing, cast or splint is removed; you may shower, but do not soak or scrub the wound.  Allow the water to run over it, and then gently pat it dry.  Swelling It is normal for you to have swelling where you had surgery.  To reduce swelling and pain, keep your toes above your nose for at least 3 days after surgery.  It may be necessary to keep your foot or leg elevated for several weeks.  If it hurts, it should be elevated.  Follow Up Call my office at [phone] when you are discharged from the hospital or surgery center to schedule an appointment to be seen two weeks after surgery.  Call my office at [phone] if you develop  a fever >101.5 F, nausea, vomiting, bleeding from the surgical site or severe pain.

## 2015-12-29 NOTE — Progress Notes (Signed)
Pharmacy Antibiotic Note  Calvin HopeMichael [name] is a 30 y.o. male admitted on 12/29/2015 with septic tenosynovitis  Pharmacy has been consulted for Vancomycin and Cefepime dosing for septic tenosynovitis.  No serum creatinine level available.  30 y.o male, no history of renal insufficiency noted.  Estimated CrCl = or > 100 ml/min assuming normal SCr.  He has a noted past history of  GERD, pneumonia and current smoker.   Vancomycin 1gm IV x1 given preop today @ 08:12 Gentamicin 80 mg IV x1 given preop today @ 08:36  Plan: Check serum creatinine per protocol Cefepime 2 gm IV q8h Vancomycin 1 gm IV q8h Monitor renal function, clinical status, culture results.  Check Vancomycin trough at steady state.   Height: 5\' 11"  (180.3 cm) Weight: 170 lb (77.111 kg) IBW/kg (Calculated) : 75.3  Temp (24hrs), Avg:97.8 F (36.6 C), Min:97.6 F (36.4 C), Max:98.1 F (36.7 C)   Recent Labs Lab 12/29/15 0712  WBC 4.9    CrCl cannot be calculated (Patient has no serum creatinine result on file.).    Allergies  Allergen Reactions  . Amoxicillin Hives  . Other Hives    RONDEC    Antimicrobials this admission: Vancomycin 5/25 >> Cefepime 5/25 >> Gentamicin x1 5/25>5/25  Dose adjustments this admission: None at this time  Microbiology results: Will follow up  Thank you for allowing pharmacy to be a part of this patient's care.  Noah Delaineuth Tabbetha Kutscher, RPh Clinical Pharmacist Pager: [phone] 12/29/2015 10:46 AM

## 2015-12-29 NOTE — Care Management Note (Signed)
Case Management Note  Patient Details  Name: Kelli HopeMichael Vannote MRN: [id number] Date of Birth: May 20, 1986  Subjective/Objective:   30 yr old male admitted with Right posterior tibial tendon septic tenosynovitis. 2. Osteomyelitis of the medial navicular. 3. Possible osteomyelitis of the medial talus and medial malleolus.  Went to surgery for: 1. Right posterior tibial tendon tenolysis. 2. Right flexor digitorum longus tenolysis. 3. Right navicular debridement and ostectomy. 4. Right talus bone biopsy. 5. Right medial malleolus bone biopsy. 6. Right subtalar arthrotomy with irrigation and debridement through a  separate incision.                  Action/Plan:  Case manager spoke with patient's worker's comp field Case manager - Eugenie Fillerianna Frye, RN, CCM concerning patient's injury and plan of care. [facility] Has PICC Line, will discharge on IV antibiotics once determination is made as to which medication will be used. Eugenie FillerDianna Frye Is with [facility] Cell: [phone],  Fax:[phone]. CM provided OP notes, will send orders for home health when ready as well as d/c summary.    Expected Discharge Date:                  Expected Discharge Plan:   home with Home Health RN  In-House Referral:     Discharge planning Services  CM Consult  Post Acute Care Choice:  Home Health Choice offered to:     DME Arranged:  N/A DME Agency:     HH Arranged:  RN, IV Antibiotics HH Agency:   (to be arranged by worker's comp)  Status of Service:  In process, will continue to follow  Medicare Important Message Given:    Date Medicare IM Given:    Medicare IM give by:    Date Additional Medicare IM Given:    Additional Medicare Important Message give by:     If discussed at Long Length of Stay Meetings, dates discussed:    Additional Comments:  Durenda GuthrieBrady, Abrie Egloff Naomi, RN 12/29/2015, 11:24 AM

## 2015-12-30 ENCOUNTER — Encounter (HOSPITAL_COMMUNITY): Payer: Self-pay | Admitting: Orthopedic Surgery

## 2015-12-30 LAB — VANCOMYCIN, TROUGH: VANCOMYCIN TR: 20 ug/mL (ref 10.0–20.0)

## 2015-12-30 LAB — BASIC METABOLIC PANEL
ANION GAP: 6 (ref 5–15)
BUN: 6 mg/dL (ref 6–20)
CALCIUM: 9 mg/dL (ref 8.9–10.3)
CO2: 29 mmol/L (ref 22–32)
CREATININE: 1.04 mg/dL (ref 0.61–1.24)
Chloride: 102 mmol/L (ref 101–111)
GFR calc Af Amer: >60 mL/min (ref >60)
GLUCOSE: 97 mg/dL (ref 65–99)
Potassium: 4.2 mmol/L (ref 3.5–5.1)
Sodium: 137 mmol/L (ref 135–145)

## 2015-12-30 LAB — CBC WITH DIFFERENTIAL/PLATELET
BASOS ABS: 0.1 10*3/uL (ref 0.0–0.1)
Basophils Relative: 1 %
EOS ABS: 0.2 10*3/uL (ref 0.0–0.7)
Eosinophils Relative: 4 %
HEMATOCRIT: 37.3 % — AB (ref 39.0–52.0)
Hemoglobin: 12 g/dL — ABNORMAL LOW (ref 13.0–17.0)
LYMPHS ABS: 0.8 10*3/uL (ref 0.7–4.0)
Lymphocytes Relative: 17 %
MCH: 27.8 pg (ref 26.0–34.0)
MCHC: 32.2 g/dL (ref 30.0–36.0)
MCV: 86.3 fL (ref 78.0–100.0)
Monocytes Absolute: 0.6 10*3/uL (ref 0.1–1.0)
Monocytes Relative: 12 %
NEUTROS ABS: 3.1 10*3/uL (ref 1.7–7.7)
NEUTROS PCT: 66 %
PLATELETS: 202 10*3/uL (ref 150–400)
RBC: 4.32 MIL/uL (ref 4.22–5.81)
RDW: 12.7 % (ref 11.5–15.5)
WBC: 4.7 10*3/uL (ref 4.0–10.5)

## 2015-12-30 LAB — HIV ANTIBODY (ROUTINE TESTING W REFLEX): HIV Screen 4th Generation wRfx: NONREACTIVE

## 2015-12-30 MED ORDER — ASPIRIN 81 MG PO CHEW
81.0000 mg | CHEWABLE_TABLET | Freq: Two times a day (BID) | ORAL | Status: DC
  Administered 2015-12-30 – 2016-01-02 (×6): 81 mg via ORAL
  Filled 2015-12-30 (×6): qty 1

## 2015-12-30 MED ORDER — SENNA 8.6 MG PO TABS: 2.0000 | ORAL_TABLET | Freq: Two times a day (BID) | ORAL | Status: AC

## 2015-12-30 MED ORDER — ASPIRIN EC 81 MG PO TBEC: 81.0000 mg | DELAYED_RELEASE_TABLET | Freq: Two times a day (BID) | ORAL | Status: AC

## 2015-12-30 MED ORDER — OXYCODONE HCL 5 MG PO TABS: 5.0000 mg | ORAL_TABLET | ORAL | Status: AC | PRN

## 2015-12-30 MED ORDER — DOCUSATE SODIUM 100 MG PO CAPS: 100.0000 mg | ORAL_CAPSULE | Freq: Two times a day (BID) | ORAL | Status: AC

## 2015-12-30 NOTE — Progress Notes (Signed)
Subjective: 1 Day Post-Op Procedure(s) (LRB): RIGHT ANKLE IRRIGATION AND DEBRIDEMENT (Right) OPEN BIOPSY OF THE RIGHT MEDIAL MALLEOLUS AND NAVICULAR (Right)  Patient reports pain as mild to moderate.  Denies fever, chills, N/V.  Tolerating POs well. Denies BM, however admits to flatulence.  Objective:   VITALS:  Temp:  [97.1 F (36.2 C)-99.4 F (37.4 C)] 98.4 F (36.9 C) (05/26 0523) Pulse Rate:  [62-71] 64 (05/26 0523) Resp:  [12-16] 16 (05/26 0523) BP: (111-137)/(63-97) 111/63 mmHg (05/26 0523) SpO2:  [97 %-100 %] 97 % (05/26 0523)  General: WDWN patient in NAD. Psych:  Appropriate mood and affect. Neuro:  A&O x 3, Moving all extremities, sensation intact to light touch HEENT:  EOMs intact Chest:  Even non-labored respirations Skin:  Posterior splint/dressing C/D/I, no rashes or lesions Extremities: warm/dry, no visible edema, erythema, or echymosis.  No lymphadenopathy. Pulses: Popliteus 2+ MSK:  ROM: EHL/FHL intact, MMT: patient can perform quad set    LABS  Recent Labs  12/29/15 0712 12/29/15 1145 12/30/15 0425  HGB 13.4 12.8* 12.0*  WBC 4.9 6.3 4.7  PLT 227 225 202    Recent Labs  12/29/15 1145 12/30/15 0425  NA  --  137  K  --  4.2  CL  --  102  CO2  --  29  BUN  --  6  CREATININE 0.97 1.04  GLUCOSE  --  97   No results for input(s): LABPT, INR in the last 72 hours.   Assessment/Plan: 1 Day Post-Op Procedure(s) (LRB): RIGHT ANKLE IRRIGATION AND DEBRIDEMENT (Right) OPEN BIOPSY OF THE RIGHT MEDIAL MALLEOLUS AND NAVICULAR (Right)  Up with therapy  Path/culture results pending Continue empiric therapy of vanc and cefipime per ID until path/culture results are available. NWB RLE Lovenox for DVT prophylaxis.  Will be D/C'd home with ASA for DVT prophylaxis.  Alfredo MartinezJustin Allenmichael Mcpartlin, PA-C, [facility] Office:  [phone]

## 2015-12-30 NOTE — Progress Notes (Signed)
Pharmacy Antibiotic Note  Calvin [name] is a 30 y.o. male admitted on 12/29/2015 with septic tenosynovitis  Pharmacy has been consulted for Vancomycin and Cefepime dosing for septic tenosynovitis.   Vancomycin trough = 20 today, but only ~ 7 hrs after previous dose was given.  Suspect "true" trough is in 15-20 range.  Remains on cefepime 2g IV q 8 hrs.  Plan: Continue Cefepime 2 gm IV q8h Continue Vancomycin 1 gm IV q8h Monitor renal function, clinical status, culture results.   Height: 5\' 11"  (180.3 cm) Weight: 170 lb (77.111 kg) IBW/kg (Calculated) : 75.3  Temp (24hrs), Avg:99 F (37.2 C), Min:98.4 F (36.9 C), Max:99.4 F (37.4 C)   Recent Labs Lab 12/29/15 0712 12/29/15 1145 12/30/15 0425 12/30/15 1550  WBC 4.9 6.3 4.7  --   CREATININE  --  0.97 1.04  --   VANCOTROUGH  --   --   --  20    Estimated Creatinine Clearance: 111.6 mL/min (by C-G formula based on Cr of 1.04).    Allergies  Allergen Reactions  . Amoxicillin Hives  . Other Hives    RONDEC    Antimicrobials this admission: Gent 5/25 x1 Vanc 5/25>> Cefepime 5/25>>  Dose adjustments this admission: None at this time  Microbiology results: Wound cultures pending  Thank you for allowing pharmacy to be a part of this patient's care.  Tad MooreJessica Zyaire Dumas, Pharm D, BCPS  Clinical Pharmacist Pager [phone]  12/30/2015 4:44 PM

## 2015-12-30 NOTE — Progress Notes (Signed)
Subjective: 1 Day Post-Op Procedure(s) (LRB): RIGHT ANKLE IRRIGATION AND DEBRIDEMENT (Right) OPEN BIOPSY OF THE RIGHT MEDIAL MALLEOLUS AND NAVICULAR (Right) Patient reports pain as mild.  Pt denies any knee pain.  Tolerating regular diet.  On vanc and cefepime.  ID notes reviewed.  Objective: Vital signs in last 24 hours: Temp:  [98.4 F (36.9 C)-99.4 F (37.4 C)] 99.1 F (37.3 C) (05/26 1323) Pulse Rate:  [62-76] 76 (05/26 1323) Resp:  [16-18] 18 (05/26 1323) BP: (111-124)/(63-88) 122/73 mmHg (05/26 1323) SpO2:  [97 %-100 %] 98 % (05/26 1323)  Intake/Output from previous day: 05/25 0701 - 05/26 0700 In: 1420 [P.O.:520; I.V.:600; IV Piggyback:300] Out: 2460 [Urine:2450; Blood:10] Intake/Output this shift:     Recent Labs  12/29/15 0712 12/29/15 1145 12/30/15 0425  HGB 13.4 12.8* 12.0*    Recent Labs  12/29/15 1145 12/30/15 0425  WBC 6.3 4.7  RBC 4.56 4.32  HCT 38.5* 37.3*  PLT 225 202    Recent Labs  12/29/15 1145 12/30/15 0425  NA  --  137  K  --  4.2  CL  --  102  CO2  --  29  BUN  --  6  CREATININE 0.97 1.04  GLUCOSE  --  97  CALCIUM  --  9.0   No results for input(s): LABPT, INR in the last 72 hours.  PE:  wn wd male in nad.  R LE splinted.  Knee without TTP.  NO effusion, swelling, erythema or warmth.  Assessment/Plan: 1 Day Post-Op Procedure(s) (LRB): RIGHT ANKLE IRRIGATION AND DEBRIDEMENT (Right) OPEN BIOPSY OF THE RIGHT MEDIAL MALLEOLUS AND NAVICULAR (Right) We'll follow cultures and continue IV vanc and cefepime for now.  Pathology results pending.  Continue NWB.  Toni ArthursHEWITT, Jakarie Pember 12/30/2015, 7:40 PM

## 2015-12-30 NOTE — Evaluation (Signed)
Physical Therapy Evaluation Patient Details Name: Calvin [name] MRN: [id number] DOB: 1986-03-11 Today's Date: 12/30/2015   History of Present Illness  pt presents after R ankle I+D and Medial Malleolus and Navicular Biopsy.  pt initially injured his ankle with a wire puncture wound at work and now being treated for Osteomyelitis.  No pertinent PMH.    Clinical Impression  Pt very motivated and does very well at maintaining NWBing on R LE.  Pt ed on use of knee scooter and states he had started practicing with his at home prior to surgery.  Pt indicates he will have good support from his family and denied having any concerns about D/C to home.  Discussed continued mobility in knee/hip and reviewed technique for performing single step at home.  Will continue to follow if remains on acute.      Follow Up Recommendations No PT follow up;Supervision - Intermittent    Equipment Recommendations  None recommended by PT    Recommendations for Other Services       Precautions / Restrictions Precautions Precautions: None Precaution Comments: pt indicates he was instructed not to use crutches or RW due to PICC in his R UE.   Restrictions Weight Bearing Restrictions: Yes RLE Weight Bearing: Non weight bearing      Mobility  Bed Mobility Overal bed mobility: Modified Independent                Transfers Overall transfer level: Needs assistance Equipment used:  (Knee Scooter) Transfers: Sit to/from Stand Sit to Stand: Supervision         General transfer comment: S for safety and A for set-up of knee scooter.  pt ed on placement of scooter and use of brakes.    Ambulation/Gait                Stairs            Wheelchair Mobility    Modified Rankin (Stroke Patients Only)       Balance Overall balance assessment: No apparent balance deficits (not formally assessed)                                           Pertinent Vitals/Pain Pain  Assessment: 0-10 Pain Score: 7  Pain Location: R ankle when hanging dependent. Pain Descriptors / Indicators: Aching;Burning Pain Intervention(s): Monitored during session;Premedicated before session;Repositioned    Home Living Family/patient expects to be discharged to:: Private residence Living Arrangements: Spouse/significant other;Parent;Children Available Help at Discharge: Family;Available 24 hours/day Type of Home: House Home Access: Stairs to enter Entrance Stairs-Rails: None Entrance Stairs-Number of Steps: 1 Home Layout: One level Home Equipment: Other (comment) (Knee Scooter)      Prior Function Level of Independence: Independent         Comments: A with heavy chores/yard work     Higher education careers adviserHand Dominance        Extremity/Trunk Assessment   Upper Extremity Assessment: Overall WFL for tasks assessed           Lower Extremity Assessment: RLE deficits/detail RLE Deficits / Details: AROM and Strength functional in knee/hip.  Not formally assessed due to pain and splint on ankle/foot.  pt indicates mild tingling in toes.    Cervical / Trunk Assessment: Normal  Communication   Communication: No difficulties  Cognition Arousal/Alertness: Awake/alert Behavior During Therapy: WFL for tasks assessed/performed Overall Cognitive Status: Within  Functional Limits for tasks assessed                      General Comments      Exercises        Assessment/Plan    PT Assessment Patient needs continued PT services  PT Diagnosis Acute pain   PT Problem List Decreased activity tolerance;Decreased balance;Decreased mobility;Decreased knowledge of use of DME;Pain  PT Treatment Interventions DME instruction;Stair training;Functional mobility training;Therapeutic activities;Therapeutic exercise;Balance training;Patient/family education   PT Goals (Current goals can be found in the Care Plan section) Acute Rehab PT Goals Patient Stated Goal: Leg to heal normally. PT  Goal Formulation: With patient Time For Goal Achievement: 01/06/16 Potential to Achieve Goals: Good    Frequency Min 5X/week   Barriers to discharge        Co-evaluation               End of Session Equipment Utilized During Treatment: Gait belt Activity Tolerance: Patient tolerated treatment well Patient left: in bed;with call bell/phone within reach Nurse Communication: Mobility status         Time: 1610-9604 PT Time Calculation (min) (ACUTE ONLY): 32 min   Charges:   PT Evaluation $PT Eval Low Complexity: 1 Procedure PT Treatments $Therapeutic Activity: 8-22 mins   PT G CodesSunny Schlein, [place] [phone] 12/30/2015, 10:02 AM

## 2015-12-30 NOTE — Progress Notes (Signed)
Spoke with Worker's Comp case manager Earl Manyiana Frye, she will be setting up patient's home IV antibiotics. [facility] [phone]) and received confirmation. Once home Iv antibiotic ordered, Dianna will need rx faxed to her. CM will continue to follow.

## 2015-12-30 NOTE — Progress Notes (Signed)
ID NOTE  Cultures still pending. Continue with vancomycin plus cefepime for now. Dr hatcher to see patient tomorrow to provide further recs  Aram BeechamCynthia B. Drue SecondSnider MD MPH [facility] [phone]

## 2015-12-31 DIAGNOSIS — M86171 Other acute osteomyelitis, right ankle and foot: Secondary | ICD-10-CM

## 2015-12-31 DIAGNOSIS — M65871 Other synovitis and tenosynovitis, right ankle and foot: Secondary | ICD-10-CM

## 2015-12-31 LAB — CBC WITH DIFFERENTIAL/PLATELET
Basophils Absolute: 0 10*3/uL (ref 0.0–0.1)
Basophils Relative: 1 %
EOS ABS: 0.2 10*3/uL (ref 0.0–0.7)
Eosinophils Relative: 4 %
HCT: 38.5 % — ABNORMAL LOW (ref 39.0–52.0)
HEMOGLOBIN: 12.5 g/dL — AB (ref 13.0–17.0)
LYMPHS ABS: 0.9 10*3/uL (ref 0.7–4.0)
LYMPHS PCT: 18 %
MCH: 27.8 pg (ref 26.0–34.0)
MCHC: 32.5 g/dL (ref 30.0–36.0)
MCV: 85.7 fL (ref 78.0–100.0)
MONOS PCT: 12 %
Monocytes Absolute: 0.6 10*3/uL (ref 0.1–1.0)
NEUTROS PCT: 65 %
Neutro Abs: 3.2 10*3/uL (ref 1.7–7.7)
Platelets: 214 10*3/uL (ref 150–400)
RBC: 4.49 MIL/uL (ref 4.22–5.81)
RDW: 12.3 % (ref 11.5–15.5)
WBC: 5 10*3/uL (ref 4.0–10.5)

## 2015-12-31 LAB — BASIC METABOLIC PANEL
Anion gap: 9 (ref 5–15)
BUN: 5 mg/dL — AB (ref 6–20)
CHLORIDE: 97 mmol/L — AB (ref 101–111)
CO2: 29 mmol/L (ref 22–32)
CREATININE: 1.04 mg/dL (ref 0.61–1.24)
Calcium: 9.3 mg/dL (ref 8.9–10.3)
GFR calc Af Amer: >60 mL/min (ref >60)
GFR calc non Af Amer: >60 mL/min (ref >60)
GLUCOSE: 94 mg/dL (ref 65–99)
Potassium: 3.7 mmol/L (ref 3.5–5.1)
SODIUM: 135 mmol/L (ref 135–145)

## 2015-12-31 NOTE — Progress Notes (Signed)
Patient ID: Kelli HopeMichael Well, male   DOB: Mar 24, 1986, 30 y.o.   MRN: [id number]    Subjective: 2 Days Post-Op Procedure(s) (LRB): RIGHT ANKLE IRRIGATION AND DEBRIDEMENT (Right) OPEN BIOPSY OF THE RIGHT MEDIAL MALLEOLUS AND NAVICULAR (Right) Patient reports pain as 3 on 0-10 scale.   Denies CP or SOB.  Voiding without difficulty. Positive flatus. Objective: Vital signs in last 24 hours: Temp:  [98 F (36.7 C)-99.2 F (37.3 C)] 98 F (36.7 C) (05/27 0514) Pulse Rate:  [57-76] 57 (05/27 0514) Resp:  [18] 18 (05/27 0514) BP: (117-130)/(67-73) 117/67 mmHg (05/27 0514) SpO2:  [95 %-99 %] 99 % (05/27 0514)  Intake/Output from previous day: 05/26 0701 - 05/27 0700 In: 920 [P.O.:920] Out: 650 [Urine:650] Intake/Output this shift:    Labs:  Recent Labs  12/29/15 0712 12/29/15 1145 12/30/15 0425 12/31/15 0700  HGB 13.4 12.8* 12.0* 12.5*    Recent Labs  12/30/15 0425 12/31/15 0700  WBC 4.7 5.0  RBC 4.32 4.49  HCT 37.3* 38.5*  PLT 202 214    Recent Labs  12/30/15 0425 12/31/15 0700  NA 137 135  K 4.2 3.7  CL 102 97*  CO2 29 29  BUN 6 5*  CREATININE 1.04 1.04  GLUCOSE 97 94  CALCIUM 9.0 9.3   No results for input(s): LABPT, INR in the last 72 hours.  Physical Exam: Sensation intact distally Compartment soft Neurologically in tact No abd pain to palpation pts RLE in cast  Assessment/Plan: 2 Days Post-Op Procedure(s) (LRB): RIGHT ANKLE IRRIGATION AND DEBRIDEMENT (Right) OPEN BIOPSY OF THE RIGHT MEDIAL MALLEOLUS AND NAVICULAR (Right) Continue on current anx regimen Continue to follow cultures - no results currently  Damia Bobrowski, Baxter Kailarmen Christina for Dr. Venita Lickahari Brooks [facility] [phone] 12/31/2015, 9:22 AM

## 2015-12-31 NOTE — Progress Notes (Signed)
INFECTIOUS DISEASE PROGRESS NOTE  ID: Calvin [name] is a 30 y.o. male with  Active Problems:   S/P foot surgery, right; for septic tenosynovitis  Subjective: Without complaints  Abtx:  Anti-infectives    Start     Dose/Rate Route Frequency Ordered Stop   12/29/15 1600  vancomycin (VANCOCIN) IVPB 1000 mg/200 mL premix     1,000 mg 200 mL/hr over 60 Minutes Intravenous Every 8 hours 12/29/15 1122     12/29/15 1215  ceFEPIme (MAXIPIME) 2 g in dextrose 5 % 50 mL IVPB     2 g 100 mL/hr over 30 Minutes Intravenous Every 8 hours 12/29/15 1102     12/29/15 0858  gentamicin (GARAMYCIN) injection  Status:  Discontinued       As needed 12/29/15 0859 12/29/15 0942   12/29/15 0833  vancomycin (VANCOCIN) powder  Status:  Discontinued       As needed 12/29/15 0901 12/29/15 0942   12/29/15 0700  vancomycin (VANCOCIN) IVPB 1000 mg/200 mL premix     1,000 mg 200 mL/hr over 60 Minutes Intravenous On call to O.R. 12/29/15 [id number] 12/29/15 0912      Medications:  Scheduled: . aspirin  81 mg Oral BID  . ceFEPime (MAXIPIME) IV  2 g Intravenous Q8H  . celecoxib  200 mg Oral Q12H  . docusate sodium  100 mg Oral BID  . pantoprazole  40 mg Oral Daily  . senna  1 tablet Oral BID  . vancomycin  1,000 mg Intravenous Q8H    Objective: Vital signs in last 24 hours: Temp:  [98 F (36.7 C)-99.2 F (37.3 C)] 98.1 F (36.7 C) (05/27 1436) Pulse Rate:  [57-74] 62 (05/27 1436) Resp:  [16-18] 16 (05/27 1436) BP: (107-130)/(67-74) 107/74 mmHg (05/27 1436) SpO2:  [95 %-99 %] 99 % (05/27 1436)   General appearance: alert, cooperative and no distress Extremities: RUE PIC  Incision/Wound: R foot is dressed. Normal light touch on toes.    Lab Results  Recent Labs  12/30/15 0425 12/31/15 0700  WBC 4.7 5.0  HGB 12.0* 12.5*  HCT 37.3* 38.5*  NA 137 135  K 4.2 3.7  CL 102 97*  CO2 29 29  BUN 6 5*  CREATININE 1.04 1.04   Liver Panel No results for input(s): PROT, ALBUMIN, AST, ALT,  ALKPHOS, BILITOT, BILIDIR, IBILI in the last 72 hours. Sedimentation Rate  Recent Labs  12/29/15 1145  ESRSEDRATE 12   C-Reactive Protein  Recent Labs  12/29/15 1145  CRP 0.8    Microbiology: No results found for this or any previous visit (from the past 240 hour(s)).  Studies/Results: Dg Chest 1 View  12/29/2015  CLINICAL DATA:  Assess PICC EXAM: CHEST 1 VIEW COMPARISON:  None. FINDINGS: The heart size and mediastinal contours are within normal limits. Both lungs are clear. The visualized skeletal structures are unremarkable. PICC has been inserted from the RIGHT arm. Tip lies at cavoatrial junction. No pneumothorax. IMPRESSION: No active disease. Satisfactory PICC line placement. Electronically Signed   By: Elsie StainJohn T Curnes M.D.   On: 12/29/2015 15:08     Assessment/Plan: Chronic osteo R medial ankle/maleolus Septic tenosynovitis Debrided 5-25  He has no Cx in the system.  I spoke with lab and they have no samples.  Would give him 6 weeks of vanco/cefepime empirically.  Available as needed.   Total days of antibiotics: 2 vanco/cefepime         Calvin [name] Infectious Diseases (pager) [phone] [email or web address] 12/31/2015, 2:55 PM  LOS: 2 days

## 2015-12-31 NOTE — Progress Notes (Signed)
Physical Therapy Treatment Patient Details Name: Calvin [name] MRN: [id number] DOB: 1986/08/05 Today's Date: 12/31/2015    History of Present Illness pt presents after R ankle I+D and Medial Malleolus and Navicular Biopsy.  pt initially injured his ankle with a wire puncture wound at work and now being treated for Osteomyelitis.  No pertinent PMH.      PT Comments    Pt seen today to work on step negotiation for home. Discussed two possible options, both of which pt/spouse stated would work. Pt deferred practice. Acute PT will follow up early next week if pt still here to continue to work on mobility.    Follow Up Recommendations  No PT follow up;Supervision - Intermittent     Equipment Recommendations  None recommended by PT    Precautions / Restrictions Precautions Precautions: None Precaution Comments: pt indicates he was instructed not to use crutches or RW due to PICC in his R UE.   Restrictions Weight Bearing Restrictions: Yes RLE Weight Bearing: Non weight bearing    Cognition Arousal/Alertness: Awake/alert Behavior During Therapy: WFL for tasks assessed/performed Overall Cognitive Status: Within Functional Limits for tasks assessed            General Comments Self care: today's session was limited to education only as pt deferred out of bed (he just got back into bed and reports he has been up/down several times today with knee scooter). Discussed 2 ways for pt to go into/out of his home with the single step (he estimates it to be 3-4 inches high only). Option 1: 3 musketeer's position with his arms around dad and spouse shoulders hopping up the single step. Option 2: use his knee scooter to "bump" up the step. Pt and spouse feel both of these options will work and both stated they have no concerns over getting him into the house. Pt deferred practice despite PTA recommendation to do so.      Pertinent Vitals/Pain Pain Assessment: 0-10 Pain Score: 5  Pain  Location: right ankle Pain Descriptors / Indicators: Aching;Burning Pain Intervention(s): Limited activity within patient's tolerance;Monitored during session;Premedicated before session     PT Goals (current goals can now be found in the care plan section) Acute Rehab PT Goals Patient Stated Goal: Leg to heal normally. PT Goal Formulation: With patient Time For Goal Achievement: 01/06/16 Potential to Achieve Goals: Good Progress towards PT goals: Progressing toward goals    Frequency  Min 5X/week    PT Plan Current plan remains appropriate    End of Session   Activity Tolerance: Patient tolerated treatment well Patient left: in bed;with call bell/phone within reach;with family/visitor present     Time: 4098-11911243-1251 PT Time Calculation (min) (ACUTE ONLY): 8 min  Charges:  $Self Care/Home Management: 8-22           Sallyanne KusterBury, Zamarian Scarano 12/31/2015, 12:57 PM  Sallyanne KusterKathy Christee Mervine, PTA, CLT Acute Rehab Services [phone] 12/31/2015, 1:03 PM

## 2016-01-01 NOTE — Progress Notes (Signed)
Subjective: 3 Days Post-Op Procedure(s) (LRB): RIGHT ANKLE IRRIGATION AND DEBRIDEMENT (Right) OPEN BIOPSY OF THE RIGHT MEDIAL MALLEOLUS AND NAVICULAR (Right) Patient reports pain as 3 on 0-10 scale.    Objective: Vital signs in last 24 hours: Temp:  [97.9 F (36.6 [name])-98.4 F (36.9 [name])] 97.9 F (36.6 [name]) (05/28 0542) Pulse Rate:  [59-66] 59 (05/28 0542) Resp:  [16] 16 (05/28 0542) BP: (107-116)/(62-74) 111/62 mmHg (05/28 0542) SpO2:  [97 %-99 %] 97 % (05/28 0542)  Intake/Output from previous day: 05/27 0701 - 05/28 0700 In: 510 [P.O.:510] Out: -  Intake/Output this shift:     Recent Labs  12/29/15 1145 12/30/15 0425 12/31/15 0700  HGB 12.8* 12.0* 12.5*    Recent Labs  12/30/15 0425 12/31/15 0700  WBC 4.7 5.0  RBC 4.32 4.49  HCT 37.3* 38.5*  PLT 202 214    Recent Labs  12/30/15 0425 12/31/15 0700  NA 137 135  K 4.2 3.7  CL 102 97*  CO2 29 29  BUN 6 5*  CREATININE 1.04 1.04  GLUCOSE 97 94  CALCIUM 9.0 9.3   No results for input(s): LABPT, INR in the last 72 hours.  Neurologically intact Intact pulses distally Dorsiflexion/Plantar flexion intact Incision: dressing [name]/D/I No cellulitis present  Assessment/Plan: 3 Days Post-Op Procedure(s) (LRB): RIGHT ANKLE IRRIGATION AND DEBRIDEMENT (Right) OPEN BIOPSY OF THE RIGHT MEDIAL MALLEOLUS AND NAVICULAR (Right) Advance diet Up with therapy Discharge home with home health when culture available.None yet. Continue Ab  Calvin [name] 01/01/2016, 7:57 AM

## 2016-01-02 MED ORDER — HEPARIN SOD (PORK) LOCK FLUSH 100 UNIT/ML IV SOLN
250.0000 [IU] | INTRAVENOUS | Status: DC | PRN
  Administered 2016-01-02: 250 [IU]
  Filled 2016-01-02: qty 3

## 2016-01-02 MED ORDER — HEPARIN SOD (PORK) LOCK FLUSH 100 UNIT/ML IV SOLN
250.0000 [IU] | Freq: Every day | INTRAVENOUS | Status: DC
  Filled 2016-01-02: qty 2.5

## 2016-01-02 MED ORDER — VANCOMYCIN HCL IN DEXTROSE 1-5 GM/200ML-% IV SOLN: 1000.0000 mg | Freq: Three times a day (TID) | INTRAVENOUS | Status: AC

## 2016-01-02 MED ORDER — DEXTROSE 5 % IV SOLN: 2.0000 g | Freq: Three times a day (TID) | INTRAVENOUS | Status: AC

## 2016-01-02 NOTE — Progress Notes (Signed)
Physical Therapy Treatment Patient Details Name: Calvin [name] MRN: [id number] DOB: Feb 17, 1986 Today's Date: 01/02/2016    History of Present Illness pt presents after R ankle I+D and Medial Malleolus and Navicular Biopsy.  pt initially injured his ankle with a wire puncture wound at work and now being treated for Osteomyelitis.  No pertinent PMH.      PT Comments    Patient is making good progress with PT.  From a mobility standpoint anticipate patient will be ready for DC home. Pt reports feeling confident with mobility, denies any questions or concerns.   Follow Up Recommendations  No PT follow up;Supervision - Intermittent     Equipment Recommendations  None recommended by PT    Recommendations for Other Services       Precautions / Restrictions Precautions Precautions: None Precaution Comments: pt indicates he was instructed not to use crutches or RW due to PICC in his R UE.   Restrictions Weight Bearing Restrictions: Yes RLE Weight Bearing: Non weight bearing    Mobility  Bed Mobility Overal bed mobility: Independent                Transfers Overall transfer level: Modified independent Equipment used:  (knee scooter) Transfers: Sit to/from Stand Sit to Stand: Modified independent (Device/Increase time)         General transfer comment: safe technique demonstrated.   Ambulation/Gait Ambulation/Gait assistance: Modified independent (Device/Increase time) Ambulation Distance (Feet): 100 Feet Assistive device:  (knee scooter)       General Gait Details: safe technique demonstrated.    Stairs Stairs:  (pt declines performing, reports feeling confident. )          Wheelchair Mobility    Modified Rankin (Stroke Patients Only)       Balance Overall balance assessment: Modified Independent                                  Cognition Arousal/Alertness: Awake/alert Behavior During Therapy: WFL for tasks  assessed/performed Overall Cognitive Status: Within Functional Limits for tasks assessed                      Exercises      General Comments        Pertinent Vitals/Pain Pain Assessment: No/denies pain Pain Intervention(s): Monitored during session    Home Living                      Prior Function            PT Goals (current goals can now be found in the care plan section) Acute Rehab PT Goals Patient Stated Goal: go home  PT Goal Formulation: With patient Time For Goal Achievement: 01/06/16 Potential to Achieve Goals: Good Progress towards PT goals: Progressing toward goals    Frequency  Min 5X/week    PT Plan Current plan remains appropriate    Co-evaluation             End of Session   Activity Tolerance: Patient tolerated treatment well Patient left: in bed;with call bell/phone within reach;with family/visitor present     Time: 4098-1191 PT Time Calculation (min) (ACUTE ONLY): 10 min  Charges:  $Gait Training: 8-22 mins                    G Codes:      Christiane Ha, PT, CSCS Pager  [phone] Office [phone]  01/02/2016, 9:22 AM

## 2016-01-02 NOTE — Progress Notes (Signed)
Subjective: 4 Days Post-Op Procedure(s) (LRB): RIGHT ANKLE IRRIGATION AND DEBRIDEMENT (Right) OPEN BIOPSY OF THE RIGHT MEDIAL MALLEOLUS AND NAVICULAR (Right) Patient reports pain as minimal.  Tolerating PO's. Reports he is ready for d/c. Denies F/C. NO n/v. Denies sCP or SOB.  Objective: Vital signs in last 24 hours: Temp:  [98 F (36.7 C)-98.2 F (36.8 C)] 98 F (36.7 C) (05/29 0520) Pulse Rate:  [58-75] 60 (05/29 0520) Resp:  [16] 16 (05/29 0520) BP: (112-116)/(63-72) 114/63 mmHg (05/29 0520) SpO2:  [98 %-99 %] 99 % (05/29 0520)  Intake/Output from previous day: 05/28 0701 - 05/29 0700 In: 1910 [P.O.:1460; IV Piggyback:450] Out: -  Intake/Output this shift:     Recent Labs  12/31/15 0700  HGB 12.5*    Recent Labs  12/31/15 0700  WBC 5.0  RBC 4.49  HCT 38.5*  PLT 214    Recent Labs  12/31/15 0700  NA 135  K 3.7  CL 97*  CO2 29  BUN 5*  CREATININE 1.04  GLUCOSE 94  CALCIUM 9.3   No results for input(s): LABPT, INR in the last 72 hours.  Well nourished. Alert and oriented x3. RRR, Lungs clear, BS x4. Abdomen soft and non tender. Right Calf soft and non tender. Right ankle splint/dressing C/D/I. No DVT signs. Compartment soft. No signs of infection.  Right LE grossly neurovascular intact.  Assessment/Plan: 4 Days Post-Op Procedure(s) (LRB): RIGHT ANKLE IRRIGATION AND DEBRIDEMENT (Right) OPEN BIOPSY OF THE RIGHT MEDIAL MALLEOLUS AND NAVICULAR (Right) D/cHome with family IV ABX Home nursing care F/u with Dr.  Victorino Dikehewitt in a week F/u with ID Follow instructions Discussed plan with Dr.hewitt   Tommy Minichiello L 01/02/2016, 8:26 AM

## 2016-01-02 NOTE — Progress Notes (Signed)
Pt would like to discuss with MD about going home Monday. His wife is miscarrying and he would like to be home with her.

## 2016-01-02 NOTE — Progress Notes (Signed)
Pt ready for discharge. Instructions/education reviewed with pt and all questions/concerns addressed. IV team to disconnect pt's PICC. Belongings gathered. Pt will be transported out via wheelchair to wife's car. Will continue to monitor

## 2016-01-02 NOTE — Care Management (Signed)
Case manager contacted Earl Manyiana Frye, RN CM @ [phone], to discuss patient's home health RN. Patient will be discharged today. Per Lafonda Mossesiana, patient will need to receive today's doses of antibiotics prior to discharge, they will have medications sent to patient's home tomorrow AM and will continue infusions as ordered. CM notified Dr. Laverta BaltimoreHewitt's PA, Aggie HackerBryson that patient will not be able to receive 10p dose of antibiotics this evening, he needs to go home after 4pm dose. Per Aggie HackerBryson this is ok. Home Health RN will be at patient's home tomorrow morning. Per Earl Manyiana Frye, she will be [facility] [place] .

## 2016-01-03 NOTE — Discharge Summary (Signed)
Physician Discharge Summary  Patient ID: Bright Spielmann MRN: [id number] DOB/AGE: 1986-08-04 30 y.o.  Admit date: 12/29/2015 Discharge date: 01/03/2016  Admission Diagnoses: right foot pain  Discharge Diagnoses:  Active Problems:   S/P foot surgery, right; for septic tenosynovitis   Discharged Condition: good  Hospital Course:  Calvin [name] is a 30 y.o. who was admitted to [facility]. They were brought to the operating room on 12/29/2015 and underwent Procedure(s): RIGHT ANKLE IRRIGATION AND DEBRIDEMENT OPEN BIOPSY OF THE RIGHT MEDIAL MALLEOLUS AND NAVICULAR.  Patient tolerated the procedure well and was later transferred to the recovery room and then to the orthopaedic floor for postoperative care.  They were given PO and IV analgesics for pain control following their surgery.  They were given 24 hours of postoperative antibiotics of  Anti-infectives    Start     Dose/Rate Route Frequency Ordered Stop   01/02/16 0000  ceFEPIme 2 g in dextrose 5 % 50 mL     2 g 100 mL/hr over 30 Minutes Intravenous Every 8 hours 01/02/16 0838     01/02/16 0000  vancomycin (VANCOCIN) 1-5 GM/200ML-% SOLN     1,000 mg 200 mL/hr over 60 Minutes Intravenous Every 8 hours 01/02/16 0838     12/29/15 1600  vancomycin (VANCOCIN) IVPB 1000 mg/200 mL premix  Status:  Discontinued     1,000 mg 200 mL/hr over 60 Minutes Intravenous Every 8 hours 12/29/15 1122 01/02/16 1842   12/29/15 1215  ceFEPIme (MAXIPIME) 2 g in dextrose 5 % 50 mL IVPB  Status:  Discontinued     2 g 100 mL/hr over 30 Minutes Intravenous Every 8 hours 12/29/15 1102 01/02/16 1842   12/29/15 0858  gentamicin (GARAMYCIN) injection  Status:  Discontinued       As needed 12/29/15 0859 12/29/15 0942   12/29/15 0833  vancomycin (VANCOCIN) powder  Status:  Discontinued       As needed 12/29/15 0901 12/29/15 0942   12/29/15 0700  vancomycin (VANCOCIN) IVPB 1000 mg/200 mL premix     1,000 mg 200 mL/hr over 60 Minutes Intravenous On  call to O.R. 12/29/15 7829 12/29/15 0912       Discharge planning consulted to help with postop disposition and equipment needs.   Dressing was with normal limits.  The patient had progressed with therapy and meeting their goals. Patient was seen in rounds and was ready to go home. Patient understands the risk and benefits at D/c home.   Consults: ID  Significant Diagnostic Studies: routine  Treatments: I&D  Discharge Exam: Blood pressure 114/63, pulse 60, temperature 98 F (36.7 C), temperature source Oral, resp. rate 16, height  (1.803 m), weight 77.111 kg (170 lb), SpO2 99 %. Well nourished. Alert and oriented x3. RRR, Lungs clear, BS x4. Abdomen soft and non tender. Right Calf soft and non tender. Right foot splint dressing C/D/I. No DVT signs. Compartment soft. No signs of infection.  Right LE grossly neurovascular intact.   Disposition: 01-Home or Self Care  Discharge Instructions    Call MD / Call 911    Complete by:  As directed   If you experience chest pain or shortness of breath, CALL 911 and be transported to the hospital emergency room.  If you develope a fever above 101 F, pus (white drainage) or increased drainage or redness at the wound, or calf pain, call your surgeon's office.     Constipation Prevention    Complete by:  As directed  Drink plenty of fluids.  Prune juice may be helpful.  You may use a stool softener, such as Colace (over the counter) 100 mg twice a day.  Use MiraLax (over the counter) for constipation as needed.     Diet - low sodium heart healthy    Complete by:  As directed      Discharge instructions    Complete by:  As directed   Use assistive devices as directed. Rigth leg non weight bearing. Take medication as directed. Home nursing care. Picc care. IV ABX as directed. Keep splint clean and dry. F/u with Dr.Audy Dauphine in one week. F/u with ID as directed.     Increase activity slowly as tolerated    Complete by:  As directed              Medication List    STOP taking these medications        doxycycline 100 MG capsule  Commonly known as:  VIBRAMYCIN     HYDROcodone-acetaminophen 5-325 MG tablet  Commonly known as:  NORCO/VICODIN      TAKE these medications        aspirin EC 81 MG tablet  Take 1 tablet (81 mg total) by mouth 2 (two) times daily.     ceFEPIme 2 g in dextrose 5 % 50 mL  Inject 2 g into the vein every 8 (eight) hours.     colchicine 0.6 MG tablet  Take 1 tablet (0.6 mg total) by mouth 2 (two) times daily.     docusate sodium 100 MG capsule  Commonly known as:  COLACE  Take 1 capsule (100 mg total) by mouth 2 (two) times daily. While taking narcotic pain medicine.     naproxen 500 MG tablet  Commonly known as:  NAPROSYN  Take 1 po BID with food prn pain     omeprazole 20 MG capsule  Commonly known as:  PRILOSEC  Take 20 mg by mouth daily.     omeprazole 20 MG capsule  Commonly known as:  PRILOSEC  Take 20 mg by mouth daily.     oxyCODONE 5 MG immediate release tablet  Commonly known as:  ROXICODONE  Take 1-2 tablets (5-10 mg total) by mouth every 4 (four) hours as needed for moderate pain or severe pain.     senna 8.6 MG Tabs tablet  Commonly known as:  SENOKOT  Take 2 tablets (17.2 mg total) by mouth 2 (two) times daily.     vancomycin 1-5 GM/200ML-% Soln  Commonly known as:  VANCOCIN  Inject 200 mLs (1,000 mg total) into the vein every 8 (eight) hours.           Follow-up Information    Follow up with Vernor Monnig, Jonny RuizJOHN, MD. Schedule an appointment as soon as possible for a visit in 2 weeks.   Specialty:  Orthopedic Surgery   Contact information:   [address] [place] [id number] [phone]       Signed: Markham JordanSTILWELL, BRYSON L 01/03/2016, 8:23 AM

## 2017-10-15 IMAGING — DX DG ANKLE COMPLETE 3+V*R*
3 series · 3 of 3 positions shown · non-contrast
Comparison: None.

CLINICAL DATA: Erythema and tenderness at the medial right ankle
after being stuck with piece of metal wire at the right ankle 1 week
ago. Initial encounter.

EXAM:
RIGHT ANKLE - COMPLETE 3+ VIEW

[ankle ap]
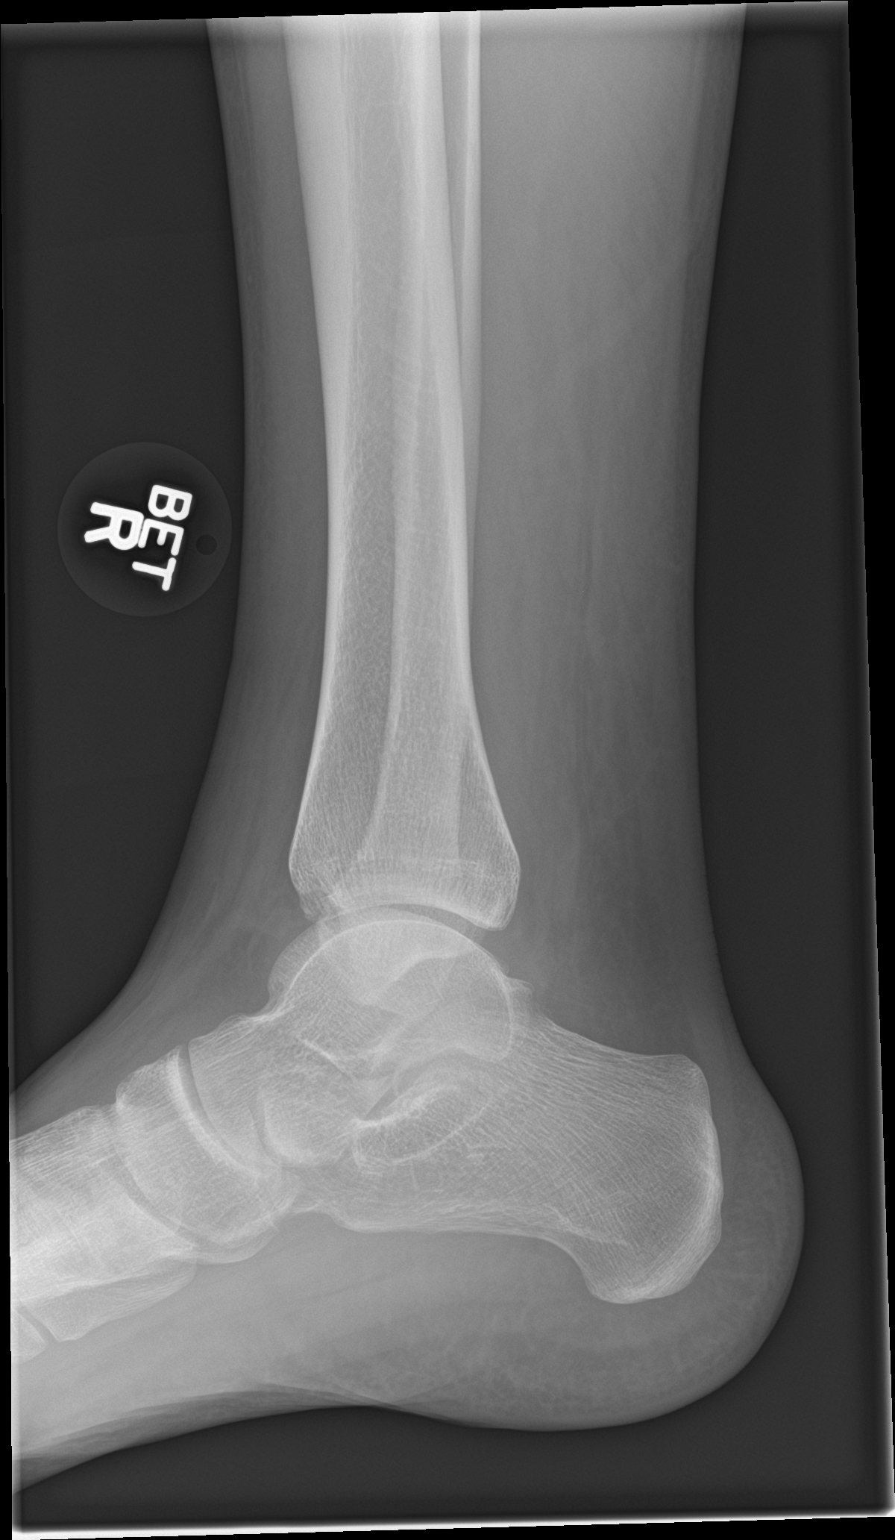

[ankle obl]
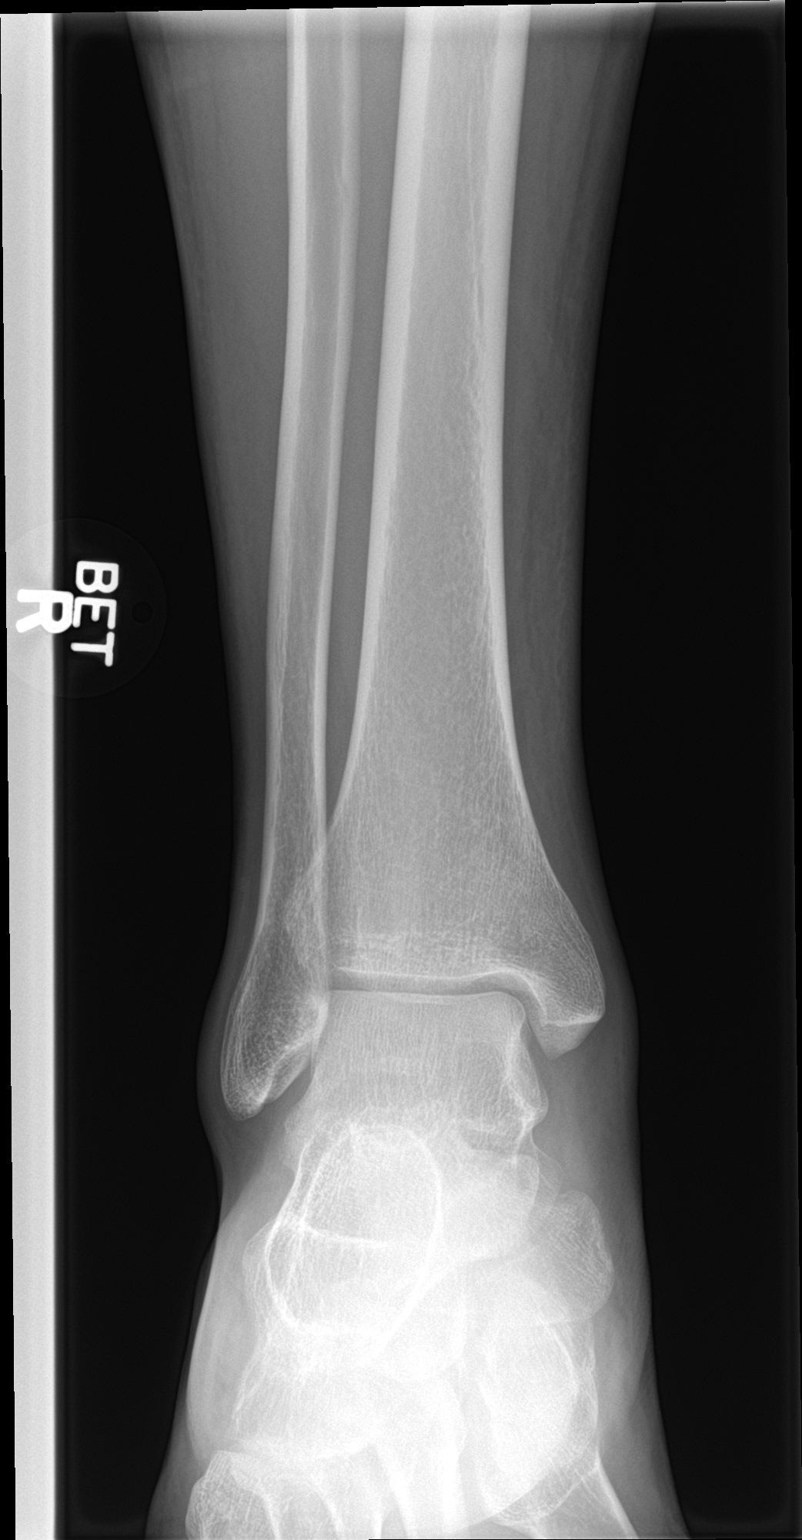

[ankle lat]
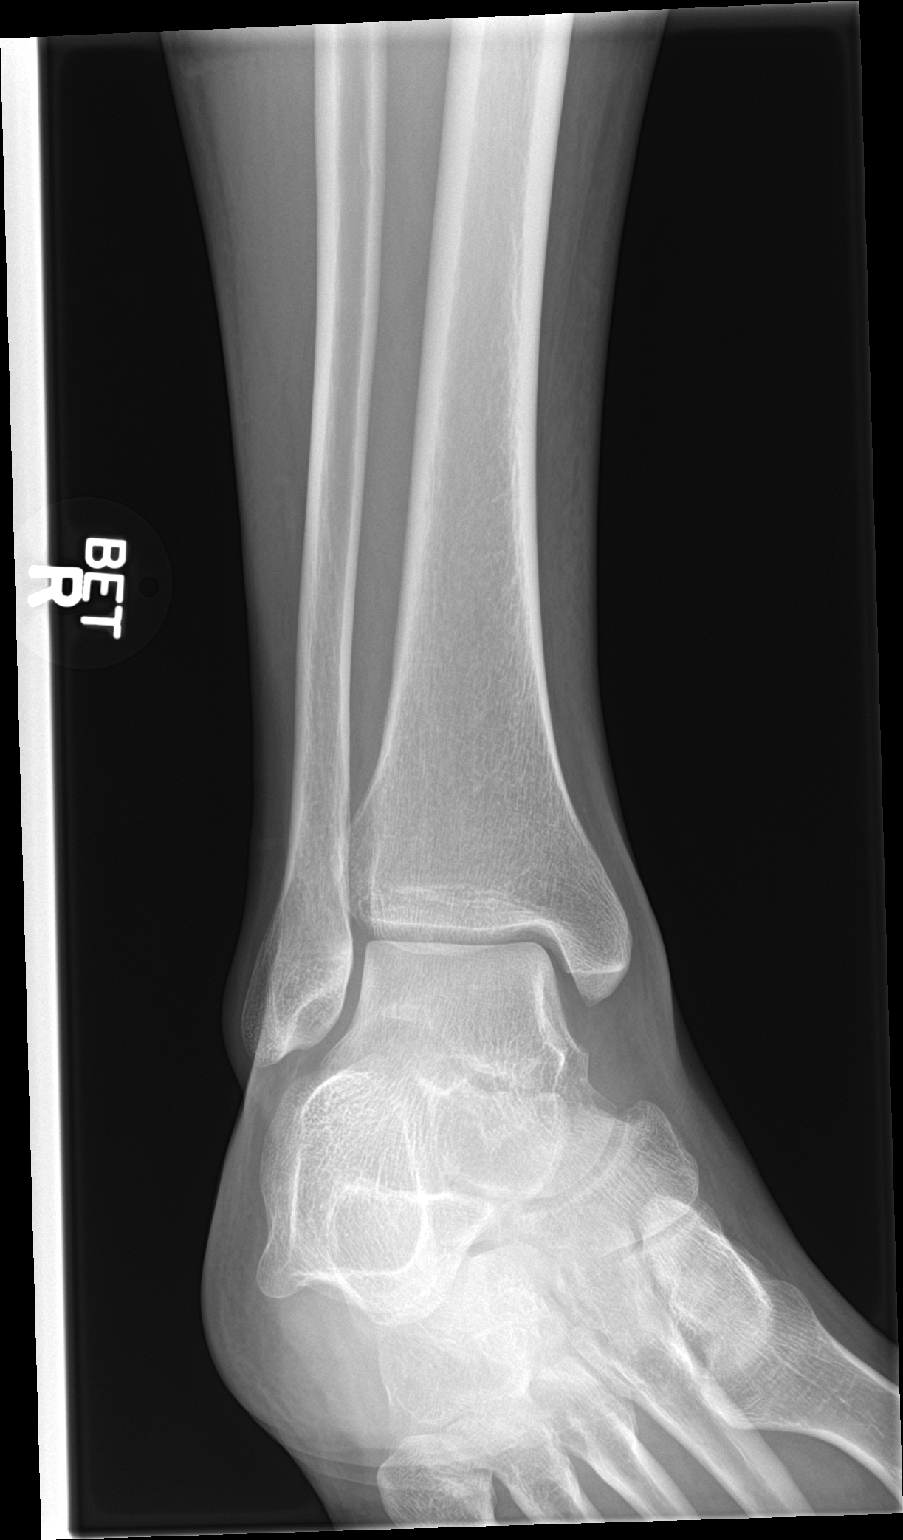

[3 of 3 positions shown; findings below may reference images not displayed]

FINDINGS: There is no evidence of fracture or dislocation. There is no
evidence of osseous erosion. The ankle mortise is intact; the
interosseous space is within normal limits. No talar tilt or
subluxation is seen.

The joint spaces are preserved. The known puncture wound is not well
characterized on radiograph. No radiopaque foreign bodies are seen.
IMPRESSION: No evidence of fracture or dislocation. No evidence of osseous
erosion. No radiopaque foreign bodies seen.

## 2018-02-12 IMAGING — DX DG CHEST 1V
1 series · 1 of 1 positions shown · non-contrast
Comparison: None.

CLINICAL DATA: Assess PICC

EXAM:
CHEST 1 VIEW

[chest ap]
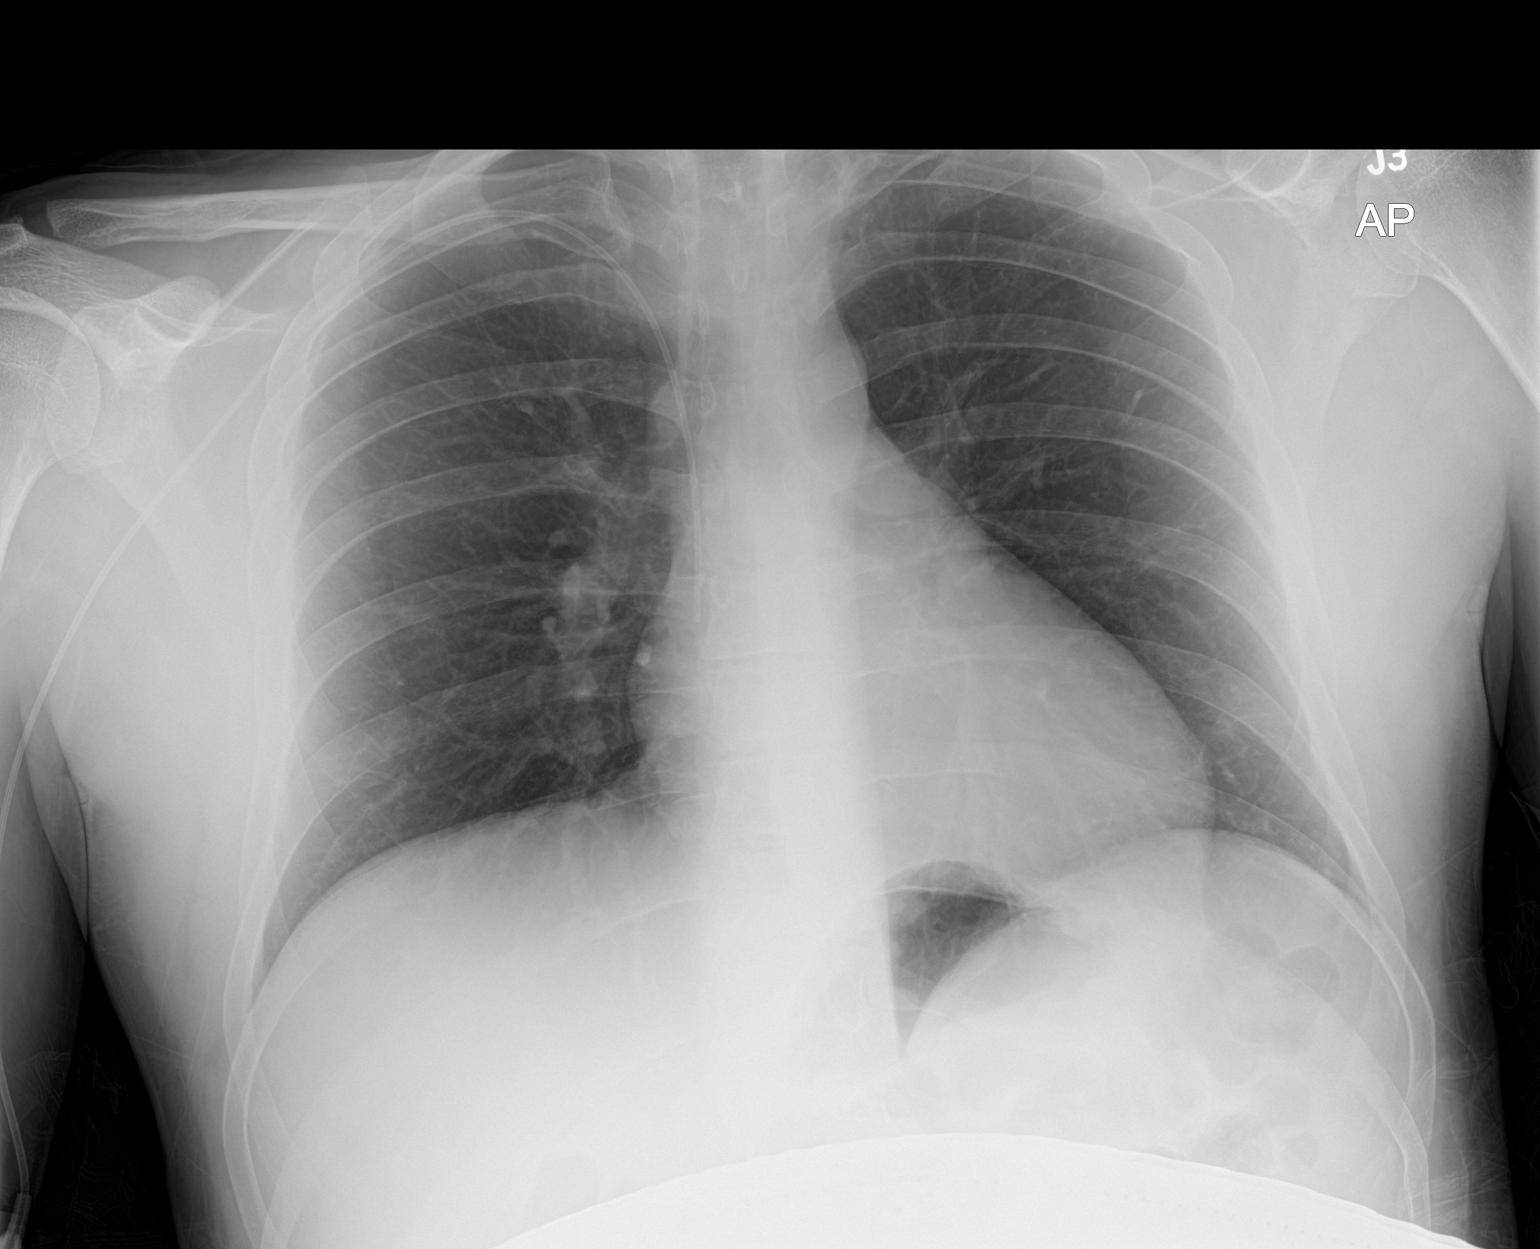

[1 of 1 positions shown; findings below may reference images not displayed]

FINDINGS: The heart size and mediastinal contours are within normal limits.
Both lungs are clear. The visualized skeletal structures are
unremarkable.

PICC has been inserted from the RIGHT arm. Tip lies at cavoatrial
junction. No pneumothorax.
IMPRESSION: No active disease.

Satisfactory PICC line placement.
